# Patient Record
Sex: Female | Born: 1935
Health system: Southern US, Community
[De-identification: ages and names within clinical notes are randomized; demographics above are authoritative.]

## PROBLEM LIST (undated history)

## (undated) DIAGNOSIS — M25569 Pain in unspecified knee: Secondary | ICD-10-CM

## (undated) DIAGNOSIS — M810 Age-related osteoporosis without current pathological fracture: Secondary | ICD-10-CM

## (undated) DIAGNOSIS — M1990 Unspecified osteoarthritis, unspecified site: Secondary | ICD-10-CM

## (undated) DIAGNOSIS — R413 Other amnesia: Secondary | ICD-10-CM

## (undated) DIAGNOSIS — D510 Vitamin B12 deficiency anemia due to intrinsic factor deficiency: Secondary | ICD-10-CM

## (undated) DIAGNOSIS — Z5320 Procedure and treatment not carried out because of patient's decision for unspecified reasons: Secondary | ICD-10-CM

## (undated) DIAGNOSIS — G3184 Mild cognitive impairment, so stated: Secondary | ICD-10-CM

## (undated) DIAGNOSIS — R011 Cardiac murmur, unspecified: Secondary | ICD-10-CM

## (undated) DIAGNOSIS — Z87440 Personal history of urinary (tract) infections: Secondary | ICD-10-CM

## (undated) DIAGNOSIS — I10 Essential (primary) hypertension: Secondary | ICD-10-CM

## (undated) HISTORY — DX: Essential (primary) hypertension: I10

## (undated) HISTORY — DX: Vitamin B12 deficiency anemia due to intrinsic factor deficiency: D51.0

## (undated) HISTORY — DX: Other amnesia: R41.3

## (undated) HISTORY — PX: ABDOMINAL HYSTERECTOMY: SHX81

## (undated) HISTORY — DX: Procedure and treatment not carried out because of patient's decision for unspecified reasons: Z53.20

## (undated) HISTORY — DX: Personal history of urinary (tract) infections: Z87.440

## (undated) HISTORY — DX: Age-related osteoporosis without current pathological fracture: M81.0

## (undated) HISTORY — PX: EYE SURGERY: SHX253

## (undated) HISTORY — DX: Mild cognitive impairment of uncertain or unknown etiology: G31.84

## (undated) HISTORY — DX: Unspecified osteoarthritis, unspecified site: M19.90

## (undated) HISTORY — PX: ROOT CANAL: SHX2363

## (undated) HISTORY — DX: Pain in unspecified knee: M25.569

---

## 2003-08-02 ENCOUNTER — Other Ambulatory Visit: Admission: RE | Admit: 2003-08-02 | Discharge: 2003-08-02 | Payer: Self-pay | Admitting: Family Medicine

## 2010-05-05 ENCOUNTER — Encounter: Payer: Self-pay | Admitting: Cardiovascular Disease

## 2010-05-05 ENCOUNTER — Ambulatory Visit: Payer: Self-pay

## 2010-10-27 NOTE — Procedures (Signed)
Summary: Summary Report  Summary Report   Imported By: Erle Crocker 05/08/2010 08:32:36  _____________________________________________________________________  External Attachment:    Type:   Image     Comment:   External Document

## 2011-06-01 ENCOUNTER — Encounter: Payer: Self-pay | Admitting: Cardiovascular Disease

## 2011-06-02 ENCOUNTER — Ambulatory Visit (INDEPENDENT_AMBULATORY_CARE_PROVIDER_SITE_OTHER): Payer: Medicare HMO | Admitting: Cardiovascular Disease

## 2011-06-02 ENCOUNTER — Encounter: Payer: Self-pay | Admitting: Cardiovascular Disease

## 2011-06-02 DIAGNOSIS — I498 Other specified cardiac arrhythmias: Secondary | ICD-10-CM

## 2011-06-02 DIAGNOSIS — Z01810 Encounter for preprocedural cardiovascular examination: Secondary | ICD-10-CM | POA: Insufficient documentation

## 2011-06-02 DIAGNOSIS — R001 Bradycardia, unspecified: Secondary | ICD-10-CM

## 2011-06-02 NOTE — Assessment & Plan Note (Signed)
Healthy active 75 yo with no symptoms undergoing low risk arthroscopic surgery.  Clear with no further w/u

## 2011-06-02 NOTE — Progress Notes (Signed)
75 yo referred for preop clearence.  Needs arthroscopic knee surgery with Dr Valentina Gu.  Has a history of relatively low HR.  A few months ago in [place] colonoscopy postponed due to low HR but done 48 hours latter with no w/u or med change.  She has no cardiac history.  She is verry active and dances.  She has no presyncope, palpitatons, SSCP or dsypnea.  Reviewed monitor done 8/11 and NSR only one episode of SA exit block pause 2.6 seconds.  On no AV nodal blocking drugs.  Baseline ECG reviewed and no BBB and normal PR.  Patient is asymptomatic, active and undergoing low risk surgery.  Needs no further w/u.  Avoid anesthesia with negative chronotropic effect or AV nodal blocking effect.  Avoid excessive pain that may trigger vagal outflow.  ROS: Denies fever, malais, weight loss, blurry vision, decreased visual acuity, cough, sputum, SOB, hemoptysis, pleuritic pain, palpitaitons, heartburn, abdominal pain, melena, lower extremity edema, claudication, or rash.  All other systems reviewed and negative   General: Affect appropriate Healthy:  appears stated age HEENT: normal Neck supple with no adenopathy JVP normal no bruits no thyromegaly Lungs clear with no wheezing and good diaphragmatic motion Heart:  S1/S2 no murmur,rub, gallop or click PMI normal Abdomen: benighn, BS positve, no tenderness, no AAA no bruit.  No HSM or HJR Distal pulses intact with no bruits No edema Neuro non-focal Skin warm and dry No muscular weakness  Medications Current Outpatient Prescriptions  Medication Sig Dispense Refill  . Calcium Carbonate-Vit D-Min (CALCIUM 1200 PO) Take 1 capsule by mouth daily.        . Calcium-Vitamin D-Vitamin K (VIACTIV) 500-100-40 MG-UNT-MCG CHEW Chew 2 each by mouth daily.        Marland Kitchen glucosamine-chondroitin 500-400 MG tablet Take 1 tablet by mouth 2 (two) times daily.        . Omega-3 Fatty Acids (FISH OIL) 1000 MG CAPS Take 1 capsule by mouth 2 (two) times daily.           Allergies Bactrim  Family History: No family history on file.  Social History: History   Social History  . Marital Status: Single    Spouse Name: N/A    Number of Children: N/A  . Years of Education: N/A   Occupational History  . Not on file.   Social History Main Topics  . Smoking status: Never Smoker   . Smokeless tobacco: Never Used  . Alcohol Use: No  . Drug Use: No  . Sexually Active: Not on file   Other Topics Concern  . Not on file   Social History Narrative  . No narrative on file    Electrocardiogram:  NSR 59 low voltage due to breast tissue.  Otherwise normal PR 178 QRS 62  Assessment and Plan

## 2011-06-02 NOTE — Patient Instructions (Signed)
Your physician recommends that you schedule a follow-up appointment in: AS NEEDED  Your physician recommends that you continue on your current medications as directed. Please refer to the Current Medication list given to you today.  

## 2011-06-02 NOTE — Assessment & Plan Note (Signed)
Normal ECG.  Monitor last year benign.  May be prone to vagal reaction.  Avoid anesthesia with negative chronotropic properties and Rx post procedure pain agressively to avoid vagal reacton.  No indication for any other cardiac w/u

## 2011-12-28 ENCOUNTER — Telehealth: Payer: Self-pay | Admitting: Cardiovascular Disease

## 2011-12-28 NOTE — Telephone Encounter (Signed)
LOV,12,Monitor faxed to Dr.Patseavouras @ [phone] 12/28/11/KM

## 2012-05-10 ENCOUNTER — Other Ambulatory Visit (HOSPITAL_COMMUNITY): Payer: Self-pay | Admitting: Urology

## 2012-05-10 DIAGNOSIS — R52 Pain, unspecified: Secondary | ICD-10-CM

## 2012-05-24 ENCOUNTER — Ambulatory Visit (HOSPITAL_COMMUNITY)
Admission: RE | Admit: 2012-05-24 | Discharge: 2012-05-24 | Disposition: A | Discharge status: Home / Self Care | Payer: Medicare HMO | Source: Ambulatory Visit | Attending: Urology | Admitting: Urology

## 2012-05-24 DIAGNOSIS — R52 Pain, unspecified: Secondary | ICD-10-CM

## 2012-05-24 DIAGNOSIS — N750 Cyst of Bartholin's gland: Secondary | ICD-10-CM | POA: Insufficient documentation

## 2012-05-24 DIAGNOSIS — K5730 Diverticulosis of large intestine without perforation or abscess without bleeding: Secondary | ICD-10-CM | POA: Insufficient documentation

## 2012-05-24 DIAGNOSIS — R109 Unspecified abdominal pain: Secondary | ICD-10-CM | POA: Insufficient documentation

## 2012-05-24 DIAGNOSIS — G589 Mononeuropathy, unspecified: Secondary | ICD-10-CM | POA: Insufficient documentation

## 2012-05-24 MED ORDER — GADOBENATE DIMEGLUMINE 529 MG/ML IV SOLN
15.0000 mL | Freq: Once | INTRAVENOUS | Status: AC | PRN
  Administered 2012-05-24: 15 mL via INTRAVENOUS

## 2014-10-10 DIAGNOSIS — M818 Other osteoporosis without current pathological fracture: Secondary | ICD-10-CM | POA: Diagnosis not present

## 2014-10-10 DIAGNOSIS — R030 Elevated blood-pressure reading, without diagnosis of hypertension: Secondary | ICD-10-CM | POA: Diagnosis not present

## 2014-10-10 DIAGNOSIS — Z131 Encounter for screening for diabetes mellitus: Secondary | ICD-10-CM | POA: Diagnosis not present

## 2014-10-10 DIAGNOSIS — R001 Bradycardia, unspecified: Secondary | ICD-10-CM | POA: Diagnosis not present

## 2014-10-10 DIAGNOSIS — Z1239 Encounter for other screening for malignant neoplasm of breast: Secondary | ICD-10-CM | POA: Diagnosis not present

## 2014-10-10 DIAGNOSIS — Z23 Encounter for immunization: Secondary | ICD-10-CM | POA: Diagnosis not present

## 2014-10-10 DIAGNOSIS — Z136 Encounter for screening for cardiovascular disorders: Secondary | ICD-10-CM | POA: Diagnosis not present

## 2014-10-10 DIAGNOSIS — Z0000 Encounter for general adult medical examination without abnormal findings: Secondary | ICD-10-CM | POA: Diagnosis not present

## 2014-10-24 DIAGNOSIS — Z1231 Encounter for screening mammogram for malignant neoplasm of breast: Secondary | ICD-10-CM | POA: Diagnosis not present

## 2014-11-15 DIAGNOSIS — Z131 Encounter for screening for diabetes mellitus: Secondary | ICD-10-CM | POA: Diagnosis not present

## 2014-11-15 DIAGNOSIS — Z136 Encounter for screening for cardiovascular disorders: Secondary | ICD-10-CM | POA: Diagnosis not present

## 2014-12-04 DIAGNOSIS — B009 Herpesviral infection, unspecified: Secondary | ICD-10-CM | POA: Diagnosis not present

## 2015-03-12 DIAGNOSIS — J069 Acute upper respiratory infection, unspecified: Secondary | ICD-10-CM | POA: Diagnosis not present

## 2015-04-28 DIAGNOSIS — R419 Unspecified symptoms and signs involving cognitive functions and awareness: Secondary | ICD-10-CM | POA: Diagnosis not present

## 2015-11-21 DIAGNOSIS — Z1231 Encounter for screening mammogram for malignant neoplasm of breast: Secondary | ICD-10-CM | POA: Diagnosis not present

## 2015-12-02 DIAGNOSIS — R05 Cough: Secondary | ICD-10-CM | POA: Diagnosis not present

## 2015-12-18 DIAGNOSIS — N63 Unspecified lump in breast: Secondary | ICD-10-CM | POA: Diagnosis not present

## 2015-12-18 DIAGNOSIS — N6002 Solitary cyst of left breast: Secondary | ICD-10-CM | POA: Diagnosis not present

## 2016-01-05 DIAGNOSIS — H524 Presbyopia: Secondary | ICD-10-CM | POA: Diagnosis not present

## 2016-01-05 DIAGNOSIS — H5210 Myopia, unspecified eye: Secondary | ICD-10-CM | POA: Diagnosis not present

## 2016-01-27 DIAGNOSIS — M25562 Pain in left knee: Secondary | ICD-10-CM | POA: Diagnosis not present

## 2016-02-05 DIAGNOSIS — M25562 Pain in left knee: Secondary | ICD-10-CM | POA: Diagnosis not present

## 2016-02-05 DIAGNOSIS — M1712 Unilateral primary osteoarthritis, left knee: Secondary | ICD-10-CM | POA: Diagnosis not present

## 2016-02-11 DIAGNOSIS — Z0000 Encounter for general adult medical examination without abnormal findings: Secondary | ICD-10-CM | POA: Diagnosis not present

## 2016-02-11 DIAGNOSIS — R001 Bradycardia, unspecified: Secondary | ICD-10-CM | POA: Diagnosis not present

## 2016-02-11 DIAGNOSIS — M810 Age-related osteoporosis without current pathological fracture: Secondary | ICD-10-CM | POA: Diagnosis not present

## 2016-02-11 DIAGNOSIS — Z01810 Encounter for preprocedural cardiovascular examination: Secondary | ICD-10-CM | POA: Diagnosis not present

## 2016-02-11 DIAGNOSIS — Z86010 Personal history of colonic polyps: Secondary | ICD-10-CM | POA: Diagnosis not present

## 2016-02-11 DIAGNOSIS — M25562 Pain in left knee: Secondary | ICD-10-CM | POA: Diagnosis not present

## 2016-02-17 ENCOUNTER — Other Ambulatory Visit: Payer: Self-pay | Admitting: Orthopedic Surgery

## 2016-02-19 DIAGNOSIS — H25811 Combined forms of age-related cataract, right eye: Secondary | ICD-10-CM | POA: Diagnosis not present

## 2016-02-19 DIAGNOSIS — H2511 Age-related nuclear cataract, right eye: Secondary | ICD-10-CM | POA: Diagnosis not present

## 2016-02-26 DIAGNOSIS — H25812 Combined forms of age-related cataract, left eye: Secondary | ICD-10-CM | POA: Diagnosis not present

## 2016-02-26 DIAGNOSIS — H2512 Age-related nuclear cataract, left eye: Secondary | ICD-10-CM | POA: Diagnosis not present

## 2016-03-10 DIAGNOSIS — G8929 Other chronic pain: Secondary | ICD-10-CM | POA: Insufficient documentation

## 2016-03-11 ENCOUNTER — Encounter (HOSPITAL_COMMUNITY)
Admission: RE | Admit: 2016-03-11 | Discharge: 2016-03-11 | Disposition: A | Discharge status: Home / Self Care | Payer: Commercial Managed Care - HMO | Source: Ambulatory Visit | Attending: Orthopedic Surgery | Admitting: Orthopedic Surgery

## 2016-03-11 ENCOUNTER — Ambulatory Visit (HOSPITAL_COMMUNITY)
Admission: RE | Admit: 2016-03-11 | Discharge: 2016-03-11 | Disposition: A | Discharge status: Home / Self Care | Payer: Commercial Managed Care - HMO | Source: Ambulatory Visit | Attending: Orthopedic Surgery | Admitting: Orthopedic Surgery

## 2016-03-11 ENCOUNTER — Encounter (HOSPITAL_COMMUNITY): Payer: Self-pay

## 2016-03-11 DIAGNOSIS — Z01812 Encounter for preprocedural laboratory examination: Secondary | ICD-10-CM | POA: Insufficient documentation

## 2016-03-11 DIAGNOSIS — Z01818 Encounter for other preprocedural examination: Secondary | ICD-10-CM | POA: Diagnosis not present

## 2016-03-11 HISTORY — DX: Unspecified osteoarthritis, unspecified site: M19.90

## 2016-03-11 HISTORY — DX: Cardiac murmur, unspecified: R01.1

## 2016-03-11 LAB — URINALYSIS, ROUTINE W REFLEX MICROSCOPIC
Bilirubin Urine: NEGATIVE
GLUCOSE, UA: NEGATIVE mg/dL
KETONES UR: NEGATIVE mg/dL
NITRITE: POSITIVE — AB
PH: 7 (ref 5.0–8.0)
PROTEIN: NEGATIVE mg/dL
Specific Gravity, Urine: 1.005 (ref 1.005–1.030)

## 2016-03-11 LAB — CBC WITH DIFFERENTIAL/PLATELET
BASOS PCT: 1 %
Basophils Absolute: 0.1 10*3/uL (ref 0.0–0.1)
EOS ABS: 0.3 10*3/uL (ref 0.0–0.7)
EOS PCT: 4 %
HCT: 43.9 % (ref 36.0–46.0)
Hemoglobin: 14.8 g/dL (ref 12.0–15.0)
LYMPHS ABS: 2.9 10*3/uL (ref 0.7–4.0)
Lymphocytes Relative: 33 %
MCH: 29.8 pg (ref 26.0–34.0)
MCHC: 33.7 g/dL (ref 30.0–36.0)
MCV: 88.3 fL (ref 78.0–100.0)
MONOS PCT: 7 %
Monocytes Absolute: 0.6 10*3/uL (ref 0.1–1.0)
Neutro Abs: 4.8 10*3/uL (ref 1.7–7.7)
Neutrophils Relative %: 55 %
PLATELETS: 353 10*3/uL (ref 150–400)
RBC: 4.97 MIL/uL (ref 3.87–5.11)
RDW: 13.6 % (ref 11.5–15.5)
WBC: 8.7 10*3/uL (ref 4.0–10.5)

## 2016-03-11 LAB — COMPREHENSIVE METABOLIC PANEL
ALT: 14 U/L (ref 14–54)
ANION GAP: 6 (ref 5–15)
AST: 21 U/L (ref 15–41)
Albumin: 3.9 g/dL (ref 3.5–5.0)
Alkaline Phosphatase: 63 U/L (ref 38–126)
BUN: 6 mg/dL (ref 6–20)
CHLORIDE: 103 mmol/L (ref 101–111)
CO2: 28 mmol/L (ref 22–32)
Calcium: 9.4 mg/dL (ref 8.9–10.3)
Creatinine, Ser: 0.93 mg/dL (ref 0.44–1.00)
GFR calc non Af Amer: 58 mL/min — ABNORMAL LOW (ref >60)
Glucose, Bld: 99 mg/dL (ref 65–99)
POTASSIUM: 4 mmol/L (ref 3.5–5.1)
SODIUM: 137 mmol/L (ref 135–145)
Total Bilirubin: 1.3 mg/dL — ABNORMAL HIGH (ref 0.3–1.2)
Total Protein: 6.5 g/dL (ref 6.5–8.1)

## 2016-03-11 LAB — SURGICAL PCR SCREEN
MRSA, PCR: NEGATIVE
Staphylococcus aureus: POSITIVE — AB

## 2016-03-11 LAB — PROTIME-INR
INR: 1.09 (ref 0.00–1.49)
Prothrombin Time: 14.3 seconds (ref 11.6–15.2)

## 2016-03-11 LAB — URINE MICROSCOPIC-ADD ON

## 2016-03-11 LAB — APTT: aPTT: 31 seconds (ref 24–37)

## 2016-03-12 LAB — URINE CULTURE

## 2016-03-12 NOTE — Progress Notes (Signed)
Anesthesia Chart Review:  Pt is a 80 year old female scheduled for L total knee arthroplasty on 03/22/2016 with Dr. Ronnie Derby.   PCP is Dr. Greig Right in [place] who has cleared pt for surgery.   PMH includes:  Heart murmur (systolic 2/6 by PCP exam [id number]). Never smoker. BMI 22  Medications include: potassium  Preoperative labs reviewed.    Chest x-ray 03/11/16 reviewed. No active cardiopulmonary disease  EKG 02/11/16: sinus bradycardia (48 bpm).   Holter monitor 05/07/10: NSR with episodes of SA exit block. Longest pause 2.6 seconds. Rare PVCs.   Pt has had work up previously (2011, 2012) for low HR and with no concerning findings. When pt saw Dr. Johnsie Cancel with cardiology for pre-op clearance in 2012, he suggested avoiding "anesthesia with negative chronotropic properties and Rx post procedure pain agressively to avoid vagal reaction".   If no changes, I anticipate pt can proceed with surgery as scheduled.   Willeen Cass, FNP-BC [facility]/Anesthesiology Phone: [phone] 03/12/2016 2:04 PM

## 2016-03-19 MED ORDER — SODIUM CHLORIDE 0.9 % IV SOLN: INTRAVENOUS | Status: DC

## 2016-03-19 MED ORDER — CEFAZOLIN SODIUM-DEXTROSE 2-4 GM/100ML-% IV SOLN
2.0000 g | INTRAVENOUS | Status: AC
  Administered 2016-03-22: 2 g via INTRAVENOUS
  Filled 2016-03-19: qty 100

## 2016-03-19 MED ORDER — ACETAMINOPHEN 500 MG PO TABS
1000.0000 mg | ORAL_TABLET | Freq: Once | ORAL | Status: AC
  Administered 2016-03-22: 1000 mg via ORAL
  Filled 2016-03-19: qty 2

## 2016-03-19 MED ORDER — BUPIVACAINE LIPOSOME 1.3 % IJ SUSP
20.0000 mL | INTRAMUSCULAR | Status: AC
  Administered 2016-03-22: 20 mL
  Filled 2016-03-19: qty 20

## 2016-03-19 MED ORDER — TRANEXAMIC ACID 1000 MG/10ML IV SOLN
1000.0000 mg | INTRAVENOUS | Status: AC
  Administered 2016-03-22: 1000 mg via INTRAVENOUS
  Filled 2016-03-19: qty 10

## 2016-03-22 ENCOUNTER — Encounter (HOSPITAL_COMMUNITY)
Admission: RE | Disposition: A | Discharge status: Home Health | Payer: Self-pay | Source: Ambulatory Visit | Attending: Orthopedic Surgery

## 2016-03-22 ENCOUNTER — Inpatient Hospital Stay (HOSPITAL_COMMUNITY): Payer: Commercial Managed Care - HMO | Admitting: Emergency Medicine

## 2016-03-22 ENCOUNTER — Encounter (HOSPITAL_COMMUNITY): Payer: Self-pay | Admitting: Certified Registered"

## 2016-03-22 ENCOUNTER — Inpatient Hospital Stay (HOSPITAL_COMMUNITY)
Admission: RE | Admit: 2016-03-22 | Discharge: 2016-03-23 | DRG: 470 | Disposition: A | Discharge status: Home Health | Payer: Commercial Managed Care - HMO | Source: Ambulatory Visit | Attending: Orthopedic Surgery | Admitting: Orthopedic Surgery

## 2016-03-22 ENCOUNTER — Inpatient Hospital Stay (HOSPITAL_COMMUNITY): Payer: Commercial Managed Care - HMO | Admitting: Anesthesiology

## 2016-03-22 DIAGNOSIS — D62 Acute posthemorrhagic anemia: Secondary | ICD-10-CM | POA: Diagnosis not present

## 2016-03-22 DIAGNOSIS — M25562 Pain in left knee: Secondary | ICD-10-CM | POA: Diagnosis present

## 2016-03-22 DIAGNOSIS — M179 Osteoarthritis of knee, unspecified: Secondary | ICD-10-CM | POA: Diagnosis not present

## 2016-03-22 DIAGNOSIS — M1712 Unilateral primary osteoarthritis, left knee: Secondary | ICD-10-CM | POA: Diagnosis not present

## 2016-03-22 DIAGNOSIS — Z96659 Presence of unspecified artificial knee joint: Secondary | ICD-10-CM

## 2016-03-22 HISTORY — PX: TOTAL KNEE ARTHROPLASTY: SHX125

## 2016-03-22 SURGERY — ARTHROPLASTY, KNEE, TOTAL
Anesthesia: Spinal | Laterality: Left

## 2016-03-22 MED ORDER — 0.9 % SODIUM CHLORIDE (POUR BTL) OPTIME
TOPICAL | Status: DC | PRN
  Administered 2016-03-22: 1000 mL

## 2016-03-22 MED ORDER — HYDROMORPHONE HCL 1 MG/ML IJ SOLN: 1.0000 mg | INTRAMUSCULAR | Status: DC | PRN

## 2016-03-22 MED ORDER — SENNOSIDES-DOCUSATE SODIUM 8.6-50 MG PO TABS: 1.0000 | ORAL_TABLET | Freq: Every evening | ORAL | Status: DC | PRN

## 2016-03-22 MED ORDER — METOCLOPRAMIDE HCL 5 MG/ML IJ SOLN
5.0000 mg | Freq: Three times a day (TID) | INTRAMUSCULAR | Status: DC | PRN
  Administered 2016-03-23: 10 mg via INTRAVENOUS
  Filled 2016-03-22 (×2): qty 2

## 2016-03-22 MED ORDER — ONDANSETRON HCL 4 MG PO TABS
4.0000 mg | ORAL_TABLET | Freq: Four times a day (QID) | ORAL | Status: DC | PRN
  Administered 2016-03-23: 4 mg via ORAL
  Filled 2016-03-22 (×2): qty 1

## 2016-03-22 MED ORDER — ROCURONIUM BROMIDE 50 MG/5ML IV SOLN
INTRAVENOUS | Status: AC
  Filled 2016-03-22: qty 1

## 2016-03-22 MED ORDER — ACETAMINOPHEN 325 MG PO TABS: 650.0000 mg | ORAL_TABLET | Freq: Four times a day (QID) | ORAL | Status: DC | PRN

## 2016-03-22 MED ORDER — PROPOFOL 10 MG/ML IV BOLUS
INTRAVENOUS | Status: AC
  Filled 2016-03-22: qty 20

## 2016-03-22 MED ORDER — BUPIVACAINE-EPINEPHRINE (PF) 0.5% -1:200000 IJ SOLN
INTRAMUSCULAR | Status: AC
  Filled 2016-03-22: qty 30

## 2016-03-22 MED ORDER — DEXTROSE 5 % IV SOLN
500.0000 mg | Freq: Four times a day (QID) | INTRAVENOUS | Status: DC | PRN
  Filled 2016-03-22: qty 5

## 2016-03-22 MED ORDER — HYDROMORPHONE HCL 1 MG/ML IJ SOLN
0.2500 mg | INTRAMUSCULAR | Status: DC | PRN
  Administered 2016-03-22: 0.25 mg via INTRAVENOUS

## 2016-03-22 MED ORDER — MIDAZOLAM HCL 2 MG/2ML IJ SOLN
INTRAMUSCULAR | Status: AC
  Filled 2016-03-22: qty 2

## 2016-03-22 MED ORDER — FENTANYL CITRATE (PF) 250 MCG/5ML IJ SOLN
INTRAMUSCULAR | Status: AC
  Filled 2016-03-22: qty 5

## 2016-03-22 MED ORDER — SODIUM CHLORIDE 0.9 % IJ SOLN
INTRAMUSCULAR | Status: DC | PRN
  Administered 2016-03-22 (×2): 10 mL

## 2016-03-22 MED ORDER — METHOCARBAMOL 500 MG PO TABS
500.0000 mg | ORAL_TABLET | Freq: Four times a day (QID) | ORAL | Status: DC | PRN
  Filled 2016-03-22: qty 1

## 2016-03-22 MED ORDER — FLEET ENEMA 7-19 GM/118ML RE ENEM: 1.0000 | ENEMA | Freq: Once | RECTAL | Status: DC | PRN

## 2016-03-22 MED ORDER — DOCUSATE SODIUM 100 MG PO CAPS
100.0000 mg | ORAL_CAPSULE | Freq: Two times a day (BID) | ORAL | Status: DC
  Administered 2016-03-22 – 2016-03-23 (×2): 100 mg via ORAL
  Filled 2016-03-22 (×2): qty 1

## 2016-03-22 MED ORDER — LACTATED RINGERS IV SOLN
INTRAVENOUS | Status: DC
  Administered 2016-03-22 (×2): via INTRAVENOUS

## 2016-03-22 MED ORDER — BUPIVACAINE-EPINEPHRINE 0.5% -1:200000 IJ SOLN
INTRAMUSCULAR | Status: DC | PRN
  Administered 2016-03-22: 20 mL

## 2016-03-22 MED ORDER — ACETAMINOPHEN 650 MG RE SUPP
650.0000 mg | Freq: Four times a day (QID) | RECTAL | Status: DC | PRN
Start: 2016-03-22 — End: 2016-03-23

## 2016-03-22 MED ORDER — CEFAZOLIN SODIUM-DEXTROSE 2-4 GM/100ML-% IV SOLN
2.0000 g | Freq: Three times a day (TID) | INTRAVENOUS | Status: AC
  Administered 2016-03-22 – 2016-03-23 (×2): 2 g via INTRAVENOUS
  Filled 2016-03-22 (×2): qty 100

## 2016-03-22 MED ORDER — MEPERIDINE HCL 25 MG/ML IJ SOLN: 6.2500 mg | INTRAMUSCULAR | Status: DC | PRN

## 2016-03-22 MED ORDER — ONDANSETRON HCL 4 MG/2ML IJ SOLN
4.0000 mg | Freq: Four times a day (QID) | INTRAMUSCULAR | Status: DC | PRN
  Administered 2016-03-23: 4 mg via INTRAVENOUS
  Filled 2016-03-22 (×3): qty 2

## 2016-03-22 MED ORDER — MENTHOL 3 MG MT LOZG
1.0000 | LOZENGE | OROMUCOSAL | Status: DC | PRN
Start: 2016-03-22 — End: 2016-03-23

## 2016-03-22 MED ORDER — LIDOCAINE HCL (CARDIAC) 20 MG/ML IV SOLN
INTRAVENOUS | Status: DC | PRN
  Administered 2016-03-22: 40 mg via INTRAVENOUS

## 2016-03-22 MED ORDER — DEXAMETHASONE SODIUM PHOSPHATE 10 MG/ML IJ SOLN
10.0000 mg | Freq: Once | INTRAMUSCULAR | Status: AC
  Administered 2016-03-23: 10 mg via INTRAVENOUS
  Filled 2016-03-22: qty 1

## 2016-03-22 MED ORDER — BUPIVACAINE IN DEXTROSE 0.75-8.25 % IT SOLN
INTRATHECAL | Status: DC | PRN
  Administered 2016-03-22: 2 mL via INTRATHECAL

## 2016-03-22 MED ORDER — PHENOL 1.4 % MT LIQD: 1.0000 | OROMUCOSAL | Status: DC | PRN

## 2016-03-22 MED ORDER — DIPHENHYDRAMINE HCL 12.5 MG/5ML PO ELIX: 12.5000 mg | ORAL_SOLUTION | ORAL | Status: DC | PRN

## 2016-03-22 MED ORDER — GLYCOPYRROLATE 0.2 MG/ML IJ SOLN
INTRAMUSCULAR | Status: DC | PRN
  Administered 2016-03-22: .2 mg via INTRAVENOUS

## 2016-03-22 MED ORDER — PHENYLEPHRINE HCL 10 MG/ML IJ SOLN
10.0000 mg | INTRAVENOUS | Status: DC | PRN
  Administered 2016-03-22: 25 ug/min via INTRAVENOUS

## 2016-03-22 MED ORDER — ZOLPIDEM TARTRATE 5 MG PO TABS: 5.0000 mg | ORAL_TABLET | Freq: Every evening | ORAL | Status: DC | PRN

## 2016-03-22 MED ORDER — OXYCODONE HCL ER 10 MG PO T12A
10.0000 mg | EXTENDED_RELEASE_TABLET | Freq: Two times a day (BID) | ORAL | Status: DC
  Administered 2016-03-22 – 2016-03-23 (×2): 10 mg via ORAL
  Filled 2016-03-22 (×2): qty 1

## 2016-03-22 MED ORDER — ALUM & MAG HYDROXIDE-SIMETH 200-200-20 MG/5ML PO SUSP: 30.0000 mL | ORAL | Status: DC | PRN

## 2016-03-22 MED ORDER — ONDANSETRON HCL 4 MG/2ML IJ SOLN: 4.0000 mg | Freq: Once | INTRAMUSCULAR | Status: DC | PRN

## 2016-03-22 MED ORDER — DIPHENHYDRAMINE HCL 50 MG/ML IJ SOLN
INTRAMUSCULAR | Status: AC
  Filled 2016-03-22: qty 3

## 2016-03-22 MED ORDER — METOCLOPRAMIDE HCL 5 MG PO TABS
5.0000 mg | ORAL_TABLET | Freq: Three times a day (TID) | ORAL | Status: DC | PRN
  Filled 2016-03-22: qty 2

## 2016-03-22 MED ORDER — EPHEDRINE SULFATE 50 MG/ML IJ SOLN
INTRAMUSCULAR | Status: DC | PRN
  Administered 2016-03-22 (×2): 5 mg via INTRAVENOUS

## 2016-03-22 MED ORDER — PROPOFOL 10 MG/ML IV BOLUS
INTRAVENOUS | Status: DC | PRN
  Administered 2016-03-22: 20 mg via INTRAVENOUS

## 2016-03-22 MED ORDER — SODIUM CHLORIDE 0.9 % IV SOLN
INTRAVENOUS | Status: DC
  Administered 2016-03-22: 21:00:00 via INTRAVENOUS

## 2016-03-22 MED ORDER — MIDAZOLAM HCL 5 MG/5ML IJ SOLN
INTRAMUSCULAR | Status: DC | PRN
  Administered 2016-03-22: 1 mg via INTRAVENOUS

## 2016-03-22 MED ORDER — TRANEXAMIC ACID 1000 MG/10ML IV SOLN
1000.0000 mg | Freq: Once | INTRAVENOUS | Status: AC
  Administered 2016-03-22: 1000 mg via INTRAVENOUS
  Filled 2016-03-22: qty 10

## 2016-03-22 MED ORDER — CIPROFLOXACIN IN D5W 400 MG/200ML IV SOLN
INTRAVENOUS | Status: AC
  Administered 2016-03-22: 400 mg via INTRAVENOUS
  Filled 2016-03-22: qty 200

## 2016-03-22 MED ORDER — BISACODYL 5 MG PO TBEC: 5.0000 mg | DELAYED_RELEASE_TABLET | Freq: Every day | ORAL | Status: DC | PRN

## 2016-03-22 MED ORDER — FENTANYL CITRATE (PF) 250 MCG/5ML IJ SOLN
INTRAMUSCULAR | Status: DC | PRN
  Administered 2016-03-22 (×2): 25 ug via INTRAVENOUS
  Administered 2016-03-22: 50 ug via INTRAVENOUS

## 2016-03-22 MED ORDER — HYDROMORPHONE HCL 1 MG/ML IJ SOLN
INTRAMUSCULAR | Status: AC
  Filled 2016-03-22: qty 1

## 2016-03-22 MED ORDER — SODIUM CHLORIDE 0.9 % IR SOLN
Status: DC | PRN
  Administered 2016-03-22 (×2): 1000 mL

## 2016-03-22 MED ORDER — ACETAMINOPHEN 500 MG PO TABS
1000.0000 mg | ORAL_TABLET | Freq: Four times a day (QID) | ORAL | Status: AC
  Administered 2016-03-22 – 2016-03-23 (×4): 1000 mg via ORAL
  Filled 2016-03-22 (×4): qty 2

## 2016-03-22 MED ORDER — CIPROFLOXACIN IN D5W 400 MG/200ML IV SOLN
400.0000 mg | Freq: Once | INTRAVENOUS | Status: AC
  Administered 2016-03-22: 400 mg via INTRAVENOUS

## 2016-03-22 MED ORDER — ASPIRIN EC 325 MG PO TBEC
325.0000 mg | DELAYED_RELEASE_TABLET | Freq: Two times a day (BID) | ORAL | Status: DC
  Administered 2016-03-22 – 2016-03-23 (×2): 325 mg via ORAL
  Filled 2016-03-22 (×2): qty 1

## 2016-03-22 MED ORDER — PROPOFOL 500 MG/50ML IV EMUL
INTRAVENOUS | Status: DC | PRN
  Administered 2016-03-22: 40 ug/kg/min via INTRAVENOUS

## 2016-03-22 MED ORDER — OXYCODONE HCL 5 MG PO TABS
5.0000 mg | ORAL_TABLET | ORAL | Status: DC | PRN
  Administered 2016-03-22 – 2016-03-23 (×3): 10 mg via ORAL
  Administered 2016-03-23: 5 mg via ORAL
  Filled 2016-03-22: qty 1
  Filled 2016-03-22 (×4): qty 2

## 2016-03-22 MED ORDER — CHLORHEXIDINE GLUCONATE 4 % EX LIQD: 60.0000 mL | Freq: Once | CUTANEOUS | Status: DC

## 2016-03-22 SURGICAL SUPPLY — 66 items
BANDAGE ACE 6X5 VEL STRL LF (GAUZE/BANDAGES/DRESSINGS) ×2 IMPLANT
BANDAGE ESMARK 6X9 LF (GAUZE/BANDAGES/DRESSINGS) ×1 IMPLANT
BLADE SAGITTAL 13X1.27X60 (BLADE) ×2 IMPLANT
BLADE SAGITTAL 13X1.27X60MM (BLADE) ×1
BLADE SAW SGTL 83.5X18.5 (BLADE) ×3 IMPLANT
BLADE SURG 10 STRL SS (BLADE) ×3 IMPLANT
BNDG CMPR 9X6 STRL LF SNTH (GAUZE/BANDAGES/DRESSINGS) ×1
BNDG ESMARK 6X9 LF (GAUZE/BANDAGES/DRESSINGS) ×3
BOWL SMART MIX CTS (DISPOSABLE) ×3 IMPLANT
CAPT KNEE TOTAL 3 ×3 IMPLANT
CEMENT BONE SIMPLEX SPEEDSET (Cement) ×6 IMPLANT
CLOSURE STERI-STRIP 1/2X4 (GAUZE/BANDAGES/DRESSINGS) ×1
CLSR STERI-STRIP ANTIMIC 1/2X4 (GAUZE/BANDAGES/DRESSINGS) ×1 IMPLANT
COVER SURGICAL LIGHT HANDLE (MISCELLANEOUS) ×3 IMPLANT
CUFF TOURNIQUET SINGLE 34IN LL (TOURNIQUET CUFF) ×3 IMPLANT
DRAPE EXTREMITY T 121X128X90 (DRAPE) ×3 IMPLANT
DRAPE INCISE IOBAN 66X45 STRL (DRAPES) ×6 IMPLANT
DRAPE PROXIMA HALF (DRAPES) IMPLANT
DRAPE U-SHAPE 47X51 STRL (DRAPES) ×3 IMPLANT
DRESSING AQUACEL AG SP 3.5X10 (GAUZE/BANDAGES/DRESSINGS) IMPLANT
DRSG ADAPTIC 3X8 NADH LF (GAUZE/BANDAGES/DRESSINGS) ×3 IMPLANT
DRSG AQUACEL AG ADV 3.5X10 (GAUZE/BANDAGES/DRESSINGS) ×2 IMPLANT
DRSG AQUACEL AG SP 3.5X10 (GAUZE/BANDAGES/DRESSINGS) ×3
DRSG PAD ABDOMINAL 8X10 ST (GAUZE/BANDAGES/DRESSINGS) ×3 IMPLANT
DURAPREP 26ML APPLICATOR (WOUND CARE) ×6 IMPLANT
ELECT REM PT RETURN 9FT ADLT (ELECTROSURGICAL) ×3
ELECTRODE REM PT RTRN 9FT ADLT (ELECTROSURGICAL) ×1 IMPLANT
GAUZE SPONGE 4X4 12PLY STRL (GAUZE/BANDAGES/DRESSINGS) ×3 IMPLANT
GLOVE BIOGEL M 7.0 STRL (GLOVE) IMPLANT
GLOVE BIOGEL PI IND STRL 7.5 (GLOVE) IMPLANT
GLOVE BIOGEL PI IND STRL 8.5 (GLOVE) ×5 IMPLANT
GLOVE BIOGEL PI INDICATOR 7.5 (GLOVE)
GLOVE BIOGEL PI INDICATOR 8.5 (GLOVE) ×10
GLOVE SURG ORTHO 8.0 STRL STRW (GLOVE) ×18 IMPLANT
GOWN STRL REUS W/ TWL LRG LVL3 (GOWN DISPOSABLE) ×1 IMPLANT
GOWN STRL REUS W/ TWL XL LVL3 (GOWN DISPOSABLE) ×2 IMPLANT
GOWN STRL REUS W/TWL 2XL LVL3 (GOWN DISPOSABLE) ×3 IMPLANT
GOWN STRL REUS W/TWL LRG LVL3 (GOWN DISPOSABLE) ×3
GOWN STRL REUS W/TWL XL LVL3 (GOWN DISPOSABLE) ×6
HANDPIECE INTERPULSE COAX TIP (DISPOSABLE) ×3
HOOD PEEL AWAY FACE SHEILD DIS (HOOD) ×9 IMPLANT
KIT BASIN OR (CUSTOM PROCEDURE TRAY) ×3 IMPLANT
KIT ROOM TURNOVER OR (KITS) ×3 IMPLANT
KNEE CAPITATED TOTAL 3 IMPLANT
MANIFOLD NEPTUNE II (INSTRUMENTS) ×3 IMPLANT
NEEDLE 22X1 1/2 (OR ONLY) (NEEDLE) ×6 IMPLANT
NS IRRIG 1000ML POUR BTL (IV SOLUTION) ×3 IMPLANT
PACK TOTAL JOINT (CUSTOM PROCEDURE TRAY) ×3 IMPLANT
PACK UNIVERSAL I (CUSTOM PROCEDURE TRAY) ×3 IMPLANT
PAD ARMBOARD 7.5X6 YLW CONV (MISCELLANEOUS) ×6 IMPLANT
PADDING CAST COTTON 6X4 STRL (CAST SUPPLIES) ×3 IMPLANT
SET HNDPC FAN SPRY TIP SCT (DISPOSABLE) ×1 IMPLANT
STAPLER VISISTAT 35W (STAPLE) ×3 IMPLANT
SUCTION FRAZIER HANDLE 10FR (MISCELLANEOUS) ×2
SUCTION TUBE FRAZIER 10FR DISP (MISCELLANEOUS) ×1 IMPLANT
SUT BONE WAX W31G (SUTURE) ×3 IMPLANT
SUT MNCRL AB 3-0 PS2 18 (SUTURE) ×2 IMPLANT
SUT VIC AB 0 CTB1 27 (SUTURE) ×6 IMPLANT
SUT VIC AB 1 CT1 27 (SUTURE) ×9
SUT VIC AB 1 CT1 27XBRD ANBCTR (SUTURE) ×2 IMPLANT
SUT VIC AB 2-0 CT1 27 (SUTURE) ×6
SUT VIC AB 2-0 CT1 TAPERPNT 27 (SUTURE) ×2 IMPLANT
SYR 20CC LL (SYRINGE) ×6 IMPLANT
TOWEL OR 17X24 6PK STRL BLUE (TOWEL DISPOSABLE) ×3 IMPLANT
TOWEL OR 17X26 10 PK STRL BLUE (TOWEL DISPOSABLE) ×3 IMPLANT
WATER STERILE IRR 1000ML POUR (IV SOLUTION) ×6 IMPLANT

## 2016-03-22 NOTE — Progress Notes (Signed)
Orthopedic Tech Progress Note Patient Details:  Brenda Lang 1935-12-08 [id number] Applied CPM to LLE.  Applied OHF with trapeze to pt.'s bed.  Left Bone Foam with pt.'s nurse. CPM Left Knee CPM Left Knee: On Left Knee Flexion (Degrees): 90 Left Knee Extension (Degrees): 0   Darrol Poke 03/22/2016, 4:39 PM

## 2016-03-22 NOTE — Anesthesia Preprocedure Evaluation (Addendum)
Anesthesia Evaluation  Patient identified by MRN, date of birth, ID band Patient awake    Reviewed: Allergy & Precautions, NPO status , Patient's Chart, lab work & pertinent test results  Airway Mallampati: I  TM Distance: >3 FB Neck ROM: Full    Dental   Pulmonary    Pulmonary exam normal        Cardiovascular Normal cardiovascular exam+ Valvular Problems/Murmurs      Neuro/Psych    GI/Hepatic   Endo/Other    Renal/GU      Musculoskeletal   Abdominal   Peds  Hematology   Anesthesia Other Findings   Reproductive/Obstetrics                            Anesthesia Physical Anesthesia Plan  ASA: II  Anesthesia Plan: Spinal   Post-op Pain Management:    Induction: Intravenous  Airway Management Planned: Natural Airway  Additional Equipment:   Intra-op Plan:   Post-operative Plan:   Informed Consent: I have reviewed the patients History and Physical, chart, labs and discussed the procedure including the risks, benefits and alternatives for the proposed anesthesia with the patient or authorized representative who has indicated his/her understanding and acceptance.     Plan Discussed with: CRNA and Surgeon  Anesthesia Plan Comments: (Systolic murmur asymptomatic. OK for spinal.)       Anesthesia Quick Evaluation

## 2016-03-22 NOTE — Anesthesia Procedure Notes (Addendum)
Procedure Name: MAC Date/Time: 03/22/2016 1:22 PM Performed by: Mariea Clonts Pre-anesthesia Checklist: Patient identified, Emergency Drugs available, Suction available, Patient being monitored and Timeout performed Patient Re-evaluated:Patient Re-evaluated prior to inductionOxygen Delivery Method: Nasal cannula   Spinal  Start time: 03/22/2016 1:17 PM End time: 03/22/2016 1:20 PM Staffing Anesthesiologist: Lillia Abed Performed by: anesthesiologist  Preanesthetic Checklist Completed: patient identified, site marked, surgical consent, pre-op evaluation, timeout performed, IV checked, risks and benefits discussed and monitors and equipment checked Spinal Block Patient position: sitting Prep: Betadine Patient monitoring: heart rate, cardiac monitor, continuous pulse ox and blood pressure Approach: right paramedian Location: L3-4 Injection technique: single-shot Needle Needle type: Pencan  Needle gauge: 24 G Needle length: 9 cm Needle insertion depth: 6 cm

## 2016-03-22 NOTE — Progress Notes (Addendum)
Notified Jenny Reichmann, RN for Dr. Ronnie Derby about urine. She called back and stated that patient was treated with Cipro. I spoke to patient who stated that she had previously taken Cipro but had not taken it for about one year. Notified Jenny Reichmann, Therapist, sports.

## 2016-03-22 NOTE — H&P (Signed)
Brenda Lang MRN:  [id number] DOB/SEX:  [birth year]/female  CHIEF COMPLAINT:  Painful left Knee  HISTORY: Patient is a 80 y.o. female presented with a history of pain in the left knee. Onset of symptoms was gradual starting a few years ago with gradually worsening course since that time. Patient has been treated conservatively with over-the-counter NSAIDs and activity modification. Patient currently rates pain in the knee at 10 out of 10 with activity. There is pain at night.  PAST MEDICAL HISTORY: Patient Active Problem List   Diagnosis Date Noted  . Preoperative cardiovascular examination 06/02/2011  . Bradycardia 06/02/2011   Past Medical History  Diagnosis Date  . Knee pain   . Heart murmur   . Arthritis    Past Surgical History  Procedure Laterality Date  . Abdominal hysterectomy    . Eye surgery       MEDICATIONS:   No prescriptions prior to admission    ALLERGIES:   Allergies  Allergen Reactions  . Bactrim Rash    REVIEW OF SYSTEMS:  A comprehensive review of systems was negative except for: Musculoskeletal: positive for arthralgias and bone pain   FAMILY HISTORY:  No family history on file.  SOCIAL HISTORY:   Social History  Substance Use Topics  . Smoking status: Never Smoker   . Smokeless tobacco: Never Used  . Alcohol Use: No     EXAMINATION:  Vital signs in last 24 hours:    There were no vitals taken for this visit.  General Appearance:    Alert, cooperative, no distress, appears stated age  Head:    Normocephalic, without obvious abnormality, atraumatic  Eyes:    PERRL, conjunctiva/corneas clear, EOM's intact, fundi    benign, both eyes  Ears:    Normal TM's and external ear canals, both ears  Nose:   Nares normal, septum midline, mucosa normal, no drainage    or sinus tenderness  Throat:   Lips, mucosa, and tongue normal; teeth and gums normal  Neck:   Supple, symmetrical, trachea midline, no adenopathy;    thyroid:  no  enlargement/tenderness/nodules; no carotid   bruit or JVD  Back:     Symmetric, no curvature, ROM normal, no CVA tenderness  Lungs:     Clear to auscultation bilaterally, respirations unlabored  Chest Wall:    No tenderness or deformity   Heart:    Regular rate and rhythm, S1 and S2 normal, no murmur, rub   or gallop  Breast Exam:    No tenderness, masses, or nipple abnormality  Abdomen:     Soft, non-tender, bowel sounds active all four quadrants,    no masses, no organomegaly  Genitalia:    Normal female without lesion, discharge or tenderness  Rectal:    Normal tone, no masses or tenderness;   guaiac negative stool  Extremities:   Extremities normal, atraumatic, no cyanosis or edema  Pulses:   2+ and symmetric all extremities  Skin:   Skin color, texture, turgor normal, no rashes or lesions  Lymph nodes:   Cervical, supraclavicular, and axillary nodes normal  Neurologic:   CNII-XII intact, normal strength, sensation and reflexes    throughout    Musculoskeletal:  ROM 0-120, Ligaments intact,  Imaging Review Plain radiographs demonstrate severe degenerative joint disease of the left knee. The overall alignment is neutral. The bone quality appears to be excellent for age and reported activity level.  Assessment/Plan: Primary osteoarthritis, left knee   The patient history, physical examination and imaging  studies are consistent with advanced degenerative joint disease of the left knee. The patient has failed conservative treatment.  The clearance notes were reviewed.  After discussion with the patient it was felt that Total Knee Replacement was indicated. The procedure,  risks, and benefits of total knee arthroplasty were presented and reviewed. The risks including but not limited to aseptic loosening, infection, blood clots, vascular injury, stiffness, patella tracking problems complications among others were discussed. The patient acknowledged the explanation, agreed to proceed with the  plan.  Donia Ast 03/22/2016, 6:33 AM

## 2016-03-22 NOTE — Transfer of Care (Signed)
Immediate Anesthesia Transfer of Care Note  Patient: Brenda Lang  Procedure(s) Performed: Procedure(s): LEFT TOTAL KNEE ARTHROPLASTY (Left)  Patient Location: PACU  Anesthesia Type:MAC  Level of Consciousness: awake, alert , oriented and patient cooperative  Airway & Oxygen Therapy: Patient Spontanous Breathing  Post-op Assessment: Report given to RN and Post -op Vital signs reviewed and stable  Post vital signs: Reviewed and stable  Last Vitals:  Filed Vitals:   03/22/16 0908 03/22/16 1530  BP: 160/78 144/78  Pulse: 57   Temp: 36.9 C   Resp: 18     Last Pain: There were no vitals filed for this visit.    Patients Stated Pain Goal: 4 ([id number])  Complications: No apparent anesthesia complications

## 2016-03-22 NOTE — Anesthesia Postprocedure Evaluation (Signed)
Anesthesia Post Note  Patient: Brenda Lang  Procedure(s) Performed: Procedure(s) (LRB): LEFT TOTAL KNEE ARTHROPLASTY (Left)  Patient location during evaluation: PACU Anesthesia Type: General Level of consciousness: awake and alert Pain management: pain level controlled Vital Signs Assessment: post-procedure vital signs reviewed and stable Respiratory status: spontaneous breathing, nonlabored ventilation and respiratory function stable Cardiovascular status: blood pressure returned to baseline and stable Postop Assessment: no signs of nausea or vomiting Anesthetic complications: no    Last Vitals:  Filed Vitals:   03/22/16 1730 03/22/16 1739  BP:    Pulse: 55 52  Temp:    Resp: 14 15    Last Pain: There were no vitals filed for this visit.               Larkin Alfred A

## 2016-03-23 ENCOUNTER — Encounter (HOSPITAL_COMMUNITY): Payer: Self-pay | Admitting: Orthopedic Surgery

## 2016-03-23 LAB — CBC
HCT: 36 % (ref 36.0–46.0)
HEMOGLOBIN: 12.1 g/dL (ref 12.0–15.0)
MCH: 30.2 pg (ref 26.0–34.0)
MCHC: 33.6 g/dL (ref 30.0–36.0)
MCV: 89.8 fL (ref 78.0–100.0)
Platelets: 297 10*3/uL (ref 150–400)
RBC: 4.01 MIL/uL (ref 3.87–5.11)
RDW: 13.6 % (ref 11.5–15.5)
WBC: 7.1 10*3/uL (ref 4.0–10.5)

## 2016-03-23 LAB — BASIC METABOLIC PANEL
ANION GAP: 7 (ref 5–15)
BUN: 6 mg/dL (ref 6–20)
CALCIUM: 8.6 mg/dL — AB (ref 8.9–10.3)
CO2: 27 mmol/L (ref 22–32)
Chloride: 104 mmol/L (ref 101–111)
Creatinine, Ser: 1.1 mg/dL — ABNORMAL HIGH (ref 0.44–1.00)
GFR calc Af Amer: 55 mL/min — ABNORMAL LOW (ref >60)
GFR calc non Af Amer: 47 mL/min — ABNORMAL LOW (ref >60)
GLUCOSE: 103 mg/dL — AB (ref 65–99)
Potassium: 3.8 mmol/L (ref 3.5–5.1)
Sodium: 138 mmol/L (ref 135–145)

## 2016-03-23 MED ORDER — ONDANSETRON HCL 4 MG PO TABS: 4.0000 mg | ORAL_TABLET | Freq: Four times a day (QID) | ORAL | Status: DC | PRN

## 2016-03-23 MED ORDER — ASPIRIN 325 MG PO TBEC: 325.0000 mg | DELAYED_RELEASE_TABLET | Freq: Two times a day (BID) | ORAL | Status: DC

## 2016-03-23 MED ORDER — CARISOPRODOL 350 MG PO TABS: 350.0000 mg | ORAL_TABLET | Freq: Four times a day (QID) | ORAL | Status: DC

## 2016-03-23 MED ORDER — OXYCODONE HCL 5 MG PO TABS: 5.0000 mg | ORAL_TABLET | ORAL | Status: DC | PRN

## 2016-03-23 NOTE — Progress Notes (Signed)
Orthopedic Tech Progress Note Patient Details:  Brenda Lang Jun 02, 1936 [id number] Ortho visit put on cpm at 1525 Patient ID: [name], female   DOB: 16-Aug-1936, 80 y.o.   MRN: [id number]   Braulio Bosch 03/23/2016, 3:25 PM

## 2016-03-23 NOTE — Progress Notes (Signed)
SPORTS MEDICINE AND JOINT REPLACEMENT  Lara Mulch, MD    Carlyon Shadow, PA-C [place], [place], [place]  [id number]                             [phone]   PROGRESS NOTE  Subjective:  negative for Chest Pain  negative for Shortness of Breath  negative for Nausea/Vomiting   negative for Calf Pain  negative for Bowel Movement   Tolerating Diet: yes         Patient reports pain as 5 on 0-10 scale.    Objective: Vital signs in last 24 hours:   Patient Vitals for the past 24 hrs:  BP Temp Temp src Pulse Resp SpO2 Height Weight  03/23/16 0429 (!) 107/59 mmHg 98.3 F (36.8 C) - (!) 54 15 93 % - -  03/23/16 0040 (!) 115/55 mmHg 98.7 F (37.1 C) Oral (!) 55 14 94 % - -  03/22/16 2006 (!) 152/65 mmHg 98 F (36.7 C) Oral (!) 46 15 98 % - -  03/22/16 1915 (!) 150/95 mmHg - - (!) 40 16 97 % - -  03/22/16 1821 (!) 147/75 mmHg - - - 17 - - -  03/22/16 1815 (!) 73/58 mmHg - - (!) 50 16 98 % - -  03/22/16 1739 - - - (!) 52 15 96 % - -  03/22/16 1730 - - - (!) 55 14 99 % - -  03/22/16 1727 (!) 146/71 mmHg - - - - - - -  03/22/16 1715 - 97.8 F (36.6 C) - 61 17 100 % - -  03/22/16 1711 (!) 140/95 mmHg - - - - - - -  03/22/16 1700 - - - (!) 44 13 98 % - -  03/22/16 1656 (!) 125/91 mmHg - - - - - - -  03/22/16 1641 (!) 141/61 mmHg - - (!) 54 10 97 % - -  03/22/16 1626 (!) 150/65 mmHg - - (!) 53 12 99 % - -  03/22/16 1611 131/67 mmHg - - (!) 57 11 100 % - -  03/22/16 1556 (!) 122/96 mmHg - - 60 11 95 % - -  03/22/16 1545 131/84 mmHg - - 67 16 97 % - -  03/22/16 1542 - - - 69 16 99 % - -  03/22/16 1530 (!) 144/78 mmHg 97 F (36.1 C) - 79 13 99 % - -  03/22/16 0908 (!) 160/78 mmHg 98.5 F (36.9 C) Oral (!) 57 18 100 % 5\' 5"  (1.651 m) 59.875 kg (132 lb)    @flow {1959:LAST@   Intake/Output from previous day:   06/26 0701 - 06/27 0700 In: 1300 [I.V.:1300] Out: 650 [Urine:600]   Intake/Output this shift:       Intake/Output      06/26 0701 - 06/27 0700 06/27 0701 -  06/28 0700   I.V. (mL/kg) 1300 (21.7)    Total Intake(mL/kg) 1300 (21.7)    Urine (mL/kg/hr) 600    Blood 50    Total Output 650     Net +650          Urine Occurrence 4 x       LABORATORY DATA:  Recent Labs  03/23/16 0517  WBC 7.1  HGB 12.1  HCT 36.0  PLT 297    Recent Labs  03/23/16 0517  NA 138  K 3.8  CL 104  CO2 27  BUN 6  CREATININE  1.10*  GLUCOSE 103*  CALCIUM 8.6*   Lab Results  Component Value Date   INR 1.09 03/11/2016    Examination:  General appearance: alert, cooperative and no distress Extremities: extremities normal, atraumatic, no cyanosis or edema  Wound Exam: clean, dry, intact   Drainage:  None: wound tissue dry  Motor Exam: Quadriceps and Hamstrings Intact  Sensory Exam: Superficial Peroneal, Deep Peroneal and Tibial normal   Assessment:    1 Day Post-Op  Procedure(s) (LRB): LEFT TOTAL KNEE ARTHROPLASTY (Left)  ADDITIONAL DIAGNOSIS:  Active Problems:   S/P total knee replacement  Acute Blood Loss Anemia   Plan: Physical Therapy as ordered Weight Bearing as Tolerated (WBAT)  DVT Prophylaxis:  Aspirin  DISCHARGE PLAN: Home  DISCHARGE NEEDS: HHPT   Patient is doing excellent, expect D/C today         Donia Ast 03/23/2016, 7:13 AM

## 2016-03-23 NOTE — Care Management Important Message (Signed)
Important Message  Patient Details  Name: Brenda Lang MRN: [id number] Date of Birth: [birth year]   Medicare Important Message Given:  Yes    Loann Quill 03/23/2016, 8:11 AM

## 2016-03-23 NOTE — Evaluation (Signed)
Occupational Therapy Evaluation and Discharge Patient Details Name: Brenda Lang MRN: [id number] DOB: [birth year] Today's Date: 03/23/2016    History of Present Illness Pt is a 80 y/o female who presents s/p elective L TKA on 03/22/16.   Clinical Impression   Pt was independent and active prior to admission. She is moving well POD 1. Overall, performing at a supervision to modified independent level. All education completed with pt and family verbalizing understanding. Family with no other questions or concerns.    Follow Up Recommendations  No OT follow up;Supervision/Assistance - 24 hour    Equipment Recommendations  None recommended by OT    Recommendations for Other Services       Precautions / Restrictions Precautions Precautions: Fall;Knee Precaution Comments: Pt was educated on use of the bone foam and NO pillow/towel roll/ice pack under knee.  Restrictions Weight Bearing Restrictions: Yes LLE Weight Bearing: Weight bearing as tolerated      Mobility Bed Mobility Overal bed mobility: Modified Independent                Transfers Overall transfer level: Needs assistance Equipment used: Rolling walker (2 wheeled) Transfers: Sit to/from Stand Sit to Stand: Supervision         General transfer comment: for safety, no physical assist    Balance Overall balance assessment: Needs assistance Sitting-balance support: Feet supported;No upper extremity supported Sitting balance-Leahy Scale: Good     Standing balance support: No upper extremity supported;During functional activity Standing balance-Leahy Scale: Fair                              ADL Overall ADL's : Needs assistance/impaired Eating/Feeding: Independent;Bed level   Grooming: Wash/dry face;Sitting;Set up               Lower Body Dressing: Supervision/safety;Sit to/from stand Lower Body Dressing Details (indicate cue type and reason): pt able to long sit and reach her  operated leg  Toilet Transfer: Supervision/safety;Ambulation;RW       Tub/ Shower Transfer: Supervision/safety;Ambulation;3 in 1;Rolling walker   Functional mobility during ADLs: Supervision/safety;Rolling walker       Vision     Perception     Praxis      Pertinent Vitals/Pain Pain Assessment: No/denies pain Faces Pain Scale: Hurts a little bit Pain Location: Knee Pain Descriptors / Indicators: Operative site guarding Pain Intervention(s): Limited activity within patient's tolerance;Monitored during session;Repositioned     Hand Dominance     Extremity/Trunk Assessment Upper Extremity Assessment Upper Extremity Assessment: Defer to OT evaluation;Overall [facility] for tasks assessed   Lower Extremity Assessment Lower Extremity Assessment: LLE deficits/detail LLE Deficits / Details: Decreased strength and AROM consistent with above mentioned procedure.    Cervical / Trunk Assessment Cervical / Trunk Assessment: Normal   Communication Communication Communication: No difficulties   Cognition Arousal/Alertness: Awake/alert Behavior During Therapy: Impulsive Overall Cognitive Status: Within Functional Limits for tasks assessed                     General Comments       Exercises      Shoulder Instructions      Home Living Family/patient expects to be discharged to:: Private residence Living Arrangements: Spouse/significant other Available Help at Discharge: Family;Available 24 hours/day Type of Home: House Home Access: Stairs to enter [employer] of Steps: 1   Home Layout: One level     Bathroom Shower/Tub: Tub/shower unit  Bathroom Toilet: Standard Bathroom Accessibility: Yes   Home Equipment: Walker - 2 wheels;Shower seat;Bedside commode          Prior Functioning/Environment Level of Independence: Independent             OT Diagnosis: Acute pain;Generalized weakness   OT Problem List:     OT Treatment/Interventions:       OT Goals(Current goals can be found in the care plan section) Acute Rehab OT Goals Patient Stated Goal: Home this afternoon  OT Frequency:     Barriers to D/C:            Co-evaluation              End of Session Equipment Utilized During Treatment: Gait belt;Rolling walker CPM Left Knee CPM Left Knee: Off Nurse Communication:  (aware pt vomited)  Activity Tolerance: Patient tolerated treatment well (vomited upon return to room) Patient left: in bed;with call bell/phone within reach;with family/visitor present;with nursing/sitter in room   Time: [id number] OT Time Calculation (min): 19 min Charges:  OT General Charges $OT Visit: 1 Procedure OT Evaluation $OT Eval Low Complexity: 1 Procedure G-Codes:    Malka So 03/23/2016, 2:08 PM [phone]

## 2016-03-23 NOTE — Op Note (Signed)
TOTAL KNEE REPLACEMENT OPERATIVE NOTE:  03/22/2016  8:25 AM  PATIENT:  Brenda Lang  80 y.o. female  PRE-OPERATIVE DIAGNOSIS:  primary osteoarthritis left knee  POST-OPERATIVE DIAGNOSIS:  primary osteoarthritis left knee  PROCEDURE:  Procedure(s): LEFT TOTAL KNEE ARTHROPLASTY  SURGEON:  Surgeon(s): Vickey Huger, MD  PHYSICIAN ASSISTANT:  Nehemiah Massed, [facility]  ANESTHESIA:   spinal  DRAINS: Hemovac  SPECIMEN: None  COUNTS:  Correct  TOURNIQUET:   Total Tourniquet Time Documented: Thigh (Left) - 41 minutes Total: Thigh (Left) - 41 minutes   DICTATION:  Indication for procedure:    The patient is a 80 y.o. female who has failed conservative treatment for primary osteoarthritis left knee.  Informed consent was obtained prior to anesthesia. The risks versus benefits of the operation were explain and in a way the patient can, and did, understand.   On the implant demand matching protocol, this patient scored 8.  Therefore, this patient did" "did not receive a polyethylene insert with vitamin E which is a high demand implant.  Description of procedure:     The patient was taken to the operating room and placed under anesthesia.  The patient was positioned in the usual fashion taking care that all body parts were adequately padded and/or protected.  I foley catheter was not placed.  A tourniquet was applied and the leg prepped and draped in the usual sterile fashion.  The extremity was exsanguinated with the esmarch and tourniquet inflated to 350 mmHg.  Pre-operative range of motion was normal.  The knee was in 5 degree of mild valgus.  A midline incision approximately 6-7 inches long was made with a #10 blade.  A new blade was used to make a parapatellar arthrotomy going 2-3 cm into the quadriceps tendon, over the patella, and alongside the medial aspect of the patellar tendon.  A synovectomy was then performed with the #10 blade and forceps. I then elevated the deep MCL off the  medial tibial metaphysis subperiosteally around to the semimembranosus attachment.    I everted the patella and used calipers to measure patellar thickness.  I used the reamer to ream down to appropriate thickness to recreate the native thickness.  I then removed excess bone with the rongeur and sagittal saw.  I used the appropriately sized template and drilled the three lug holes.  I then put the trial in place and measured the thickness with the calipers to ensure recreation of the native thickness.  The trial was then removed and the patella subluxed and the knee brought into flexion.  A homan retractor was place to retract and protect the patella and lateral structures.  A Z-retractor was place medially to protect the medial structures.  The extra-medullary alignment system was used to make cut the tibial articular surface perpendicular to the anamotic axis of the tibia and in 3 degrees of posterior slope.  The cut surface and alignment jig was removed.  I then used the intramedullary alignment guide to make a 4 valgus cut on the distal femur.  I then marked out the epicondylar axis on the distal femur.  The posterior condylar axis measured 5 degrees.  I then used the anterior referencing sizer and measured the femur to be a size 8.  The 4-In-1 cutting block was screwed into place in external rotation matching the posterior condylar angle, making our cuts perpendicular to the epicondylar axis.  Anterior, posterior and chamfer cuts were made with the sagittal saw.  The cutting block and  cut pieces were removed.  A lamina spreader was placed in 90 degrees of flexion.  The ACL, PCL, menisci, and posterior condylar osteophytes were removed.  A 10 mm spacer blocked was found to offer good flexion and extension gap balance after minimal in degree releasing.   The scoop retractor was then placed and the femoral finishing block was pinned in place.  The small sagittal saw was used as well as the lug drill to  finish the femur.  The block and cut surfaces were removed and the medullary canal hole filled with autograft bone from the cut pieces.  The tibia was delivered forward in deep flexion and external rotation.  A size E tray was selected and pinned into place centered on the medial 1/3 of the tibial tubercle.  The reamer and keel was used to prepare the tibia through the tray.    I then trialed with the size 8 femur, size E tibia, a 10 mm insert and the 32 patella.  I had excellent flexion/extension gap balance, excellent patella tracking.  Flexion was full and beyond 120 degrees; extension was zero.  These components were chosen and the staff opened them to me on the back table while the knee was lavaged copiously and the cement mixed.  The soft tissue was infiltrated with 60cc of exparel 1.3% through a 21 gauge needle.  I cemented in the components and removed all excess cement.  The polyethylene tibial component was snapped into place and the knee placed in extension while cement was hardening.  The capsule was infilltrated with 30cc of .25% Marcaine with epinephrine.  A hemovac was place in the joint exiting superolaterally.  A pain pump was place superomedially superficial to the arthrotomy.  Once the cement was hard, the tourniquet was let down.  Hemostasis was obtained.  The arthrotomy was closed with figure-8 #1 vicryl sutures.  The deep soft tissues were closed with #0 vicryls and the subcuticular layer closed with a running #2-0 vicryl.  The skin was reapproximated and closed with skin staples.  The wound was dressed with xeroform, 4 x4's, 2 ABD sponges, a single layer of webril and a TED stocking.   The patient was then awakened, extubated, and taken to the recovery room in stable condition.  BLOOD LOSS:  300cc DRAINS: 1 hemovac, 1 pain catheter COMPLICATIONS:  None.  PLAN OF CARE: Admit to inpatient   PATIENT DISPOSITION:  PACU - hemodynamically stable.   Delay start of Pharmacological  VTE agent (>24hrs) due to surgical blood loss or risk of bleeding:  not applicable  Please fax a copy of this op note to my office at [phone] (please only include page 1 and 2 of the Case Information op note)

## 2016-03-23 NOTE — Progress Notes (Signed)
Orthopedic Tech Progress Note Patient Details:  Brenda Lang 07-11-1936 [id number]  Patient ID: [name], female   DOB: 1936/03/19, 80 y.o.   MRN: [id number] Applied cpm 0-90  Karolee Stamps 03/23/2016, 5:19 AM

## 2016-03-23 NOTE — Care Management Note (Signed)
Case Management Note  Patient Details  Name: Brenda Lang MRN: [id number] Date of Birth: 1935/10/09  Subjective/Objective:   80 yr old female s/p left total knee arthroplasty.                 Action/Plan: Patient was preoperatively setup with [facility]. No changes. Patient will have family support at discharge.    Expected Discharge Date:    03/24/16              Expected Discharge Plan:  [place]  In-House Referral:     Discharge planning Services  CM Consult  Post Acute Care Choice:  Home Health Choice offered to:  Patient  DME Arranged:    DME Agency:     HH Arranged:  PT [place]:  [facility] (now [facility])  Status of Service:  Completed, signed off  If discussed at [employer] of Stay Meetings, dates discussed:    Additional Comments:  Ninfa Meeker, RN 03/23/2016, 11:53 AM

## 2016-03-23 NOTE — Evaluation (Signed)
Physical Therapy Evaluation Patient Details Name: Brenda Lang MRN: [id number] DOB: 1936-05-26 Today's Date: 03/23/2016   History of Present Illness  Pt is a 80 y/o female who presents s/p elective L TKA on 03/22/16.  Clinical Impression  Pt admitted with above diagnosis. Pt currently with functional limitations due to the deficits listed below (see PT Problem List). At the time of PT eval pt was able to perform transfers and ambulation with gross supervision for safety. Pt will benefit from skilled PT to increase their independence and safety with mobility to allow discharge to the venue listed below.       Follow Up Recommendations Home health PT;Supervision for mobility/OOB    Equipment Recommendations  None recommended by PT    Recommendations for Other Services       Precautions / Restrictions Precautions Precautions: Fall;Knee Precaution Comments: Pt was educated on use of the bone foam and NO pillow/towel roll/ice pack under knee.  Restrictions Weight Bearing Restrictions: Yes LLE Weight Bearing: Weight bearing as tolerated      Mobility  Bed Mobility Overal bed mobility: Modified Independent                Transfers Overall transfer level: Needs assistance Equipment used: Rolling walker (2 wheeled) Transfers: Sit to/from Stand Sit to Stand: Supervision         General transfer comment: Light supervision for safety. Pt was able to power-up to full stand without assistance or any noted unsteadiness.   Ambulation/Gait Ambulation/Gait assistance: Supervision Ambulation Distance (Feet): 250 Feet Assistive device: Rolling walker (2 wheeled) Gait Pattern/deviations: Step-through pattern;Decreased stride length Gait velocity: Decreased Gait velocity interpretation: Below normal speed for age/gender General Gait Details: VC's for improved posture, sequencing and general safety with the RW.   Stairs            Wheelchair Mobility    Modified  [place] (Stroke Patients Only)       Balance Overall balance assessment: Needs assistance Sitting-balance support: Feet supported;No upper extremity supported Sitting balance-Leahy Scale: Good     Standing balance support: No upper extremity supported;During functional activity Standing balance-Leahy Scale: Fair                               Pertinent Vitals/Pain Pain Assessment: Faces Faces Pain Scale: Hurts a little bit Pain Location: Knee Pain Descriptors / Indicators: Operative site guarding Pain Intervention(s): Limited activity within patient's tolerance;Monitored during session;Repositioned    Home Living Family/patient expects to be discharged to:: Private residence Living Arrangements: Spouse/significant other Available Help at Discharge: Family;Available 24 hours/day Type of Home: House Home Access: Stairs to enter   [employer] of Steps: 1 Home Layout: One level Home Equipment: Walker - 2 wheels;Shower seat;Bedside commode      Prior Function Level of Independence: Independent               Hand Dominance        Extremity/Trunk Assessment   Upper Extremity Assessment: Defer to OT evaluation;Overall [facility] for tasks assessed           Lower Extremity Assessment: LLE deficits/detail   LLE Deficits / Details: Decreased strength and AROM consistent with above mentioned procedure.   Cervical / Trunk Assessment: Normal  Communication   Communication: No difficulties  Cognition Arousal/Alertness: Awake/alert Behavior During Therapy: WFL for tasks assessed/performed Overall Cognitive Status: Within Functional Limits for tasks assessed  General Comments      Exercises Total Joint Exercises Ankle Circles/Pumps: 10 reps Quad Sets: 10 reps Hip ABduction/ADduction: 10 reps Straight Leg Raises: 10 reps Long Arc Quad: 10 reps Knee Flexion: 5 reps (stretching - 20-30" hold) Goniometric ROM: 97  AROM in sitting      Assessment/Plan    PT Assessment Patient needs continued PT services  PT Diagnosis Difficulty walking;Acute pain   PT Problem List Decreased strength;Decreased range of motion;Decreased activity tolerance;Decreased balance;Decreased mobility;Decreased knowledge of use of DME;Decreased safety awareness;Decreased knowledge of precautions;Pain  PT Treatment Interventions DME instruction;Gait training;Stair training;Functional mobility training;Therapeutic activities;Therapeutic exercise;Neuromuscular re-education;Patient/family education   PT Goals (Current goals can be found in the Care Plan section) Acute Rehab PT Goals Patient Stated Goal: Home this afternoon PT Goal Formulation: With patient Time For Goal Achievement: 03/26/16 Potential to Achieve Goals: Good    Frequency 7X/week   Barriers to discharge        Co-evaluation               End of Session Equipment Utilized During Treatment: Gait belt Activity Tolerance: Patient tolerated treatment well Patient left: in chair;with call bell/phone within reach;with family/visitor present Nurse Communication: Mobility status         Time: [id number] PT Time Calculation (min) (ACUTE ONLY): 28 min   Charges:   PT Evaluation $PT Eval Moderate Complexity: 1 Procedure PT Treatments $Gait Training: 8-22 mins   PT G Codes:        Rolinda Roan April 13, 2016, 12:23 PM   Rolinda Roan, PT, DPT Acute Rehabilitation Services Pager: [phone]

## 2016-03-23 NOTE — Progress Notes (Signed)
Pt ready for discharge. Education/instructions reviewed with pt and family, and all questions/concerns addressed. IV removed and belongings gathered. PT will be transported out via wheelchair to family member's car. Will continue to monitor

## 2016-03-23 NOTE — Progress Notes (Signed)
Physical Therapy Progress Note    03/23/16 1520  PT Visit Information  Last PT Received On 03/23/16  Ambulation/Gait  Stairs Yes  Stair Management No rails;Backwards;With walker  Number of Stairs 1  General stair comments Pt was instructed in negotiation of 1 stair with RW for support by Governor Rooks, PTA at the end of OT session. Per PTA, pt performed well and does not require any further instruction.    This PT answered all of pt and family's questions after stair training. Will continue to follow.   Rolinda Roan, PT, DPT Acute Rehabilitation Services Pager: [phone]

## 2016-03-23 NOTE — Discharge Summary (Signed)
SPORTS MEDICINE & JOINT REPLACEMENT   Lara Mulch, MD   Carlyon Shadow, PA-C [address], [place], [place]  [address]                             [phone]  PATIENT ID: Brenda Lang        MRN:  [id number]          DOB/AGE: 1936-09-23 / 80 y.o.    DISCHARGE SUMMARY  ADMISSION DATE:    03/22/2016 DISCHARGE DATE:   03/23/2016   ADMISSION DIAGNOSIS: primary osteoarthritis left knee    DISCHARGE DIAGNOSIS:  primary osteoarthritis left knee    ADDITIONAL DIAGNOSIS: Active Problems:   S/P total knee replacement  Past Medical History  Diagnosis Date  . Knee pain   . Heart murmur   . Arthritis     PROCEDURE: Procedure(s): LEFT TOTAL KNEE ARTHROPLASTY on 03/22/2016  CONSULTS:     HISTORY:  See H&P in chart  HOSPITAL COURSE:  RAQUELLE Lang is a 80 y.o. admitted on 03/22/2016 and found to have a diagnosis of primary osteoarthritis left knee.  After appropriate laboratory studies were obtained  they were taken to the operating room on 03/22/2016 and underwent Procedure(s): LEFT TOTAL KNEE ARTHROPLASTY.   They were given perioperative antibiotics:  Anti-infectives    Start     Dose/Rate Route Frequency Ordered Stop   03/22/16 2100  ceFAZolin (ANCEF) IVPB 2g/100 mL premix     2 g 200 mL/hr over 30 Minutes Intravenous Every 8 hours 03/22/16 1959 03/23/16 0724   03/22/16 1030  ceFAZolin (ANCEF) IVPB 2g/100 mL premix     2 g 200 mL/hr over 30 Minutes Intravenous To ShortStay Surgical 03/19/16 1050 03/22/16 1326   03/22/16 0930  ciprofloxacin (CIPRO) IVPB 400 mg     400 mg 200 mL/hr over 60 Minutes Intravenous  Once 03/22/16 0928 03/22/16 1040    .  Patient given tranexamic acid IV or topical and exparel intra-operatively.  Tolerated the procedure well.    POD# 1: Vital signs were stable.  Patient denied Chest pain, shortness of breath, or calf pain.  Patient was started on Lovenox 30 mg subcutaneously twice daily at 8am.  Consults to PT, OT, and care management  were made.  The patient was weight bearing as tolerated.  CPM was placed on the operative leg 0-90 degrees for 6-8 hours a day. When out of the CPM, patient was placed in the foam block to achieve full extension. Incentive spirometry was taught.  Dressing was changed.       POD #2, Continued  PT for ambulation and exercise program.  IV saline locked.  O2 discontinued.    The remainder of the hospital course was dedicated to ambulation and strengthening.   The patient was discharged on 1 Day Post-Op in  Good condition.  Blood products given:none  DIAGNOSTIC STUDIES: Recent vital signs: Patient Vitals for the past 24 hrs:  BP Temp Temp src Pulse Resp SpO2  03/23/16 0429 (!) 107/59 mmHg 98.3 F (36.8 C) - (!) 54 15 93 %  03/23/16 0040 (!) 115/55 mmHg 98.7 F (37.1 C) Oral (!) 55 14 94 %  03/22/16 2006 (!) 152/65 mmHg 98 F (36.7 C) Oral (!) 46 15 98 %  03/22/16 1915 (!) 150/95 mmHg - - (!) 40 16 97 %  03/22/16 1821 (!) 147/75 mmHg - - - 17 -  03/22/16 1815 (!) 73/58 mmHg - - Marland Kitchen)  50 16 98 %  03/22/16 1739 - - - (!) 52 15 96 %  03/22/16 1730 - - - (!) 55 14 99 %  03/22/16 1727 (!) 146/71 mmHg - - - - -  03/22/16 1715 - 97.8 F (36.6 C) - 61 17 100 %  03/22/16 1711 (!) 140/95 mmHg - - - - -  03/22/16 1700 - - - (!) 44 13 98 %  03/22/16 1656 (!) 125/91 mmHg - - - - -  03/22/16 1641 (!) 141/61 mmHg - - (!) 54 10 97 %  03/22/16 1626 (!) 150/65 mmHg - - (!) 53 12 99 %  03/22/16 1611 131/67 mmHg - - (!) 57 11 100 %  03/22/16 1556 (!) 122/96 mmHg - - 60 11 95 %  03/22/16 1545 131/84 mmHg - - 67 16 97 %  03/22/16 1542 - - - 69 16 99 %  03/22/16 1530 (!) 144/78 mmHg 97 F (36.1 C) - 79 13 99 %       Recent laboratory studies:  Recent Labs  03/23/16 0517  WBC 7.1  HGB 12.1  HCT 36.0  PLT 297    Recent Labs  03/23/16 0517  NA 138  K 3.8  CL 104  CO2 27  BUN 6  CREATININE 1.10*  GLUCOSE 103*  CALCIUM 8.6*   Lab Results  Component Value Date   INR 1.09 03/11/2016      Recent Radiographic Studies :  Dg Chest 2 View  03/11/2016  CLINICAL DATA:  Preop for total left knee arthroplasty on June 26. History of heart murmur, arthritis. EXAM: CHEST  2 VIEW COMPARISON:  None. FINDINGS: Heart size is normal. Overall cardiomediastinal silhouette is within normal limits in size and configuration. Probable mild scarring/fibrosis within the lower lobes bilaterally. No confluent opacity to suggest a developing pneumonia. No pleural effusion. Osseous structures about the chest are unremarkable. IMPRESSION: No active cardiopulmonary disease. Electronically Signed   By: Franki Cabot M.D.   On: 03/11/2016 11:45    DISCHARGE INSTRUCTIONS:   DISCHARGE MEDICATIONS:     Medication List    TAKE these medications        aspirin 325 MG EC tablet  Take 1 tablet (325 mg total) by mouth 2 (two) times daily.     CALCIUM 600+D 600-800 MG-UNIT Tabs  Generic drug:  Calcium Carb-Cholecalciferol  Take 1 tablet by mouth daily.     carisoprodol 350 MG tablet  Commonly known as:  SOMA  Take 1 tablet (350 mg total) by mouth 4 (four) times daily.     Fish Oil 1000 MG Caps  Take 1 capsule by mouth 2 (two) times daily.     glucosamine-chondroitin 500-400 MG tablet  Take 1 tablet by mouth 2 (two) times daily.     ondansetron 4 MG tablet  Commonly known as:  ZOFRAN  Take 1 tablet (4 mg total) by mouth every 6 (six) hours as needed for nausea.     oxyCODONE 5 MG immediate release tablet  Commonly known as:  Oxy IR/ROXICODONE  Take 1-2 tablets (5-10 mg total) by mouth every 4 (four) hours as needed for breakthrough pain.     potassium gluconate 595 (99 K) MG Tabs tablet  Take 595 mg by mouth daily as needed (for supplementation).     VIACTIV [id number] MG-UNT-MCG Chew  Generic drug:  Calcium-Vitamin D-Vitamin K  Chew 1 each by mouth daily. Reported on 03/08/2016     vitamin B-12 1000 MCG tablet  Commonly known as:  CYANOCOBALAMIN  Take 1,000 mcg by mouth once a week.      Vitamin D3 5000 units Tabs  Take 5,000 Units by mouth daily as needed (for supplementation).        FOLLOW UP VISIT:       Follow-up Information    Follow up with [facility].   Why:  Someone from [place] [facility]), will contact you to arrange start date and time for therapy.   Contact information:   [address] [place] [address] [phone]       DISPOSITION: HOME VS. SNF  CONDITION:  Good   Donia Ast 03/23/2016, 3:13 PM

## 2016-03-24 DIAGNOSIS — Z471 Aftercare following joint replacement surgery: Secondary | ICD-10-CM | POA: Diagnosis not present

## 2016-03-24 DIAGNOSIS — Z96652 Presence of left artificial knee joint: Secondary | ICD-10-CM | POA: Diagnosis not present

## 2016-03-25 DIAGNOSIS — Z471 Aftercare following joint replacement surgery: Secondary | ICD-10-CM | POA: Diagnosis not present

## 2016-03-25 DIAGNOSIS — Z96652 Presence of left artificial knee joint: Secondary | ICD-10-CM | POA: Diagnosis not present

## 2016-03-26 DIAGNOSIS — Z96652 Presence of left artificial knee joint: Secondary | ICD-10-CM | POA: Diagnosis not present

## 2016-03-26 DIAGNOSIS — Z471 Aftercare following joint replacement surgery: Secondary | ICD-10-CM | POA: Diagnosis not present

## 2016-03-27 DIAGNOSIS — Z471 Aftercare following joint replacement surgery: Secondary | ICD-10-CM | POA: Diagnosis not present

## 2016-03-27 DIAGNOSIS — Z96652 Presence of left artificial knee joint: Secondary | ICD-10-CM | POA: Diagnosis not present

## 2016-03-29 DIAGNOSIS — Z96652 Presence of left artificial knee joint: Secondary | ICD-10-CM | POA: Diagnosis not present

## 2016-03-29 DIAGNOSIS — Z471 Aftercare following joint replacement surgery: Secondary | ICD-10-CM | POA: Diagnosis not present

## 2016-03-31 DIAGNOSIS — Z471 Aftercare following joint replacement surgery: Secondary | ICD-10-CM | POA: Diagnosis not present

## 2016-03-31 DIAGNOSIS — Z96652 Presence of left artificial knee joint: Secondary | ICD-10-CM | POA: Diagnosis not present

## 2016-04-02 DIAGNOSIS — Z96652 Presence of left artificial knee joint: Secondary | ICD-10-CM | POA: Diagnosis not present

## 2016-04-02 DIAGNOSIS — Z471 Aftercare following joint replacement surgery: Secondary | ICD-10-CM | POA: Diagnosis not present

## 2016-04-05 DIAGNOSIS — Z471 Aftercare following joint replacement surgery: Secondary | ICD-10-CM | POA: Diagnosis not present

## 2016-04-05 DIAGNOSIS — Z96652 Presence of left artificial knee joint: Secondary | ICD-10-CM | POA: Diagnosis not present

## 2016-04-06 DIAGNOSIS — M25562 Pain in left knee: Secondary | ICD-10-CM | POA: Diagnosis not present

## 2016-04-06 DIAGNOSIS — Z96652 Presence of left artificial knee joint: Secondary | ICD-10-CM | POA: Diagnosis not present

## 2016-04-07 DIAGNOSIS — Z96652 Presence of left artificial knee joint: Secondary | ICD-10-CM | POA: Diagnosis not present

## 2016-04-07 DIAGNOSIS — M62552 Muscle wasting and atrophy, not elsewhere classified, left thigh: Secondary | ICD-10-CM | POA: Diagnosis not present

## 2016-04-07 DIAGNOSIS — M25562 Pain in left knee: Secondary | ICD-10-CM | POA: Diagnosis not present

## 2016-04-07 DIAGNOSIS — R2689 Other abnormalities of gait and mobility: Secondary | ICD-10-CM | POA: Diagnosis not present

## 2016-04-09 DIAGNOSIS — M62552 Muscle wasting and atrophy, not elsewhere classified, left thigh: Secondary | ICD-10-CM | POA: Diagnosis not present

## 2016-04-09 DIAGNOSIS — M25562 Pain in left knee: Secondary | ICD-10-CM | POA: Diagnosis not present

## 2016-04-09 DIAGNOSIS — Z96652 Presence of left artificial knee joint: Secondary | ICD-10-CM | POA: Diagnosis not present

## 2016-04-09 DIAGNOSIS — R2689 Other abnormalities of gait and mobility: Secondary | ICD-10-CM | POA: Diagnosis not present

## 2016-04-13 DIAGNOSIS — M62552 Muscle wasting and atrophy, not elsewhere classified, left thigh: Secondary | ICD-10-CM | POA: Diagnosis not present

## 2016-04-13 DIAGNOSIS — R2689 Other abnormalities of gait and mobility: Secondary | ICD-10-CM | POA: Diagnosis not present

## 2016-04-13 DIAGNOSIS — M25562 Pain in left knee: Secondary | ICD-10-CM | POA: Diagnosis not present

## 2016-04-13 DIAGNOSIS — Z96652 Presence of left artificial knee joint: Secondary | ICD-10-CM | POA: Diagnosis not present

## 2016-04-15 DIAGNOSIS — R2689 Other abnormalities of gait and mobility: Secondary | ICD-10-CM | POA: Diagnosis not present

## 2016-04-15 DIAGNOSIS — Z96652 Presence of left artificial knee joint: Secondary | ICD-10-CM | POA: Diagnosis not present

## 2016-04-15 DIAGNOSIS — M62552 Muscle wasting and atrophy, not elsewhere classified, left thigh: Secondary | ICD-10-CM | POA: Diagnosis not present

## 2016-04-15 DIAGNOSIS — M25562 Pain in left knee: Secondary | ICD-10-CM | POA: Diagnosis not present

## 2016-04-16 DIAGNOSIS — F418 Other specified anxiety disorders: Secondary | ICD-10-CM | POA: Diagnosis not present

## 2016-04-16 DIAGNOSIS — N3000 Acute cystitis without hematuria: Secondary | ICD-10-CM | POA: Diagnosis not present

## 2016-04-17 DIAGNOSIS — Z96652 Presence of left artificial knee joint: Secondary | ICD-10-CM | POA: Diagnosis not present

## 2016-04-17 DIAGNOSIS — R079 Chest pain, unspecified: Secondary | ICD-10-CM | POA: Diagnosis not present

## 2016-04-17 DIAGNOSIS — E0789 Other specified disorders of thyroid: Secondary | ICD-10-CM | POA: Diagnosis not present

## 2016-04-17 DIAGNOSIS — R0602 Shortness of breath: Secondary | ICD-10-CM | POA: Diagnosis not present

## 2016-04-17 DIAGNOSIS — R072 Precordial pain: Secondary | ICD-10-CM | POA: Diagnosis not present

## 2016-04-17 DIAGNOSIS — N390 Urinary tract infection, site not specified: Secondary | ICD-10-CM | POA: Diagnosis not present

## 2016-04-17 DIAGNOSIS — E041 Nontoxic single thyroid nodule: Secondary | ICD-10-CM | POA: Diagnosis not present

## 2016-04-17 DIAGNOSIS — R0789 Other chest pain: Secondary | ICD-10-CM | POA: Diagnosis not present

## 2016-04-17 DIAGNOSIS — R001 Bradycardia, unspecified: Secondary | ICD-10-CM | POA: Diagnosis not present

## 2016-04-18 DIAGNOSIS — R001 Bradycardia, unspecified: Secondary | ICD-10-CM | POA: Diagnosis not present

## 2016-04-18 DIAGNOSIS — R0789 Other chest pain: Secondary | ICD-10-CM | POA: Diagnosis not present

## 2016-04-18 DIAGNOSIS — R079 Chest pain, unspecified: Secondary | ICD-10-CM | POA: Diagnosis not present

## 2016-04-20 DIAGNOSIS — R2689 Other abnormalities of gait and mobility: Secondary | ICD-10-CM | POA: Diagnosis not present

## 2016-04-20 DIAGNOSIS — Z96652 Presence of left artificial knee joint: Secondary | ICD-10-CM | POA: Diagnosis not present

## 2016-04-20 DIAGNOSIS — M62552 Muscle wasting and atrophy, not elsewhere classified, left thigh: Secondary | ICD-10-CM | POA: Diagnosis not present

## 2016-04-20 DIAGNOSIS — M25562 Pain in left knee: Secondary | ICD-10-CM | POA: Diagnosis not present

## 2016-04-22 DIAGNOSIS — M25562 Pain in left knee: Secondary | ICD-10-CM | POA: Diagnosis not present

## 2016-04-22 DIAGNOSIS — R2689 Other abnormalities of gait and mobility: Secondary | ICD-10-CM | POA: Diagnosis not present

## 2016-04-22 DIAGNOSIS — M62552 Muscle wasting and atrophy, not elsewhere classified, left thigh: Secondary | ICD-10-CM | POA: Diagnosis not present

## 2016-04-22 DIAGNOSIS — Z96652 Presence of left artificial knee joint: Secondary | ICD-10-CM | POA: Diagnosis not present

## 2016-04-27 DIAGNOSIS — R2689 Other abnormalities of gait and mobility: Secondary | ICD-10-CM | POA: Diagnosis not present

## 2016-04-27 DIAGNOSIS — M25562 Pain in left knee: Secondary | ICD-10-CM | POA: Diagnosis not present

## 2016-04-27 DIAGNOSIS — Z96652 Presence of left artificial knee joint: Secondary | ICD-10-CM | POA: Diagnosis not present

## 2016-04-27 DIAGNOSIS — M62552 Muscle wasting and atrophy, not elsewhere classified, left thigh: Secondary | ICD-10-CM | POA: Diagnosis not present

## 2016-04-28 DIAGNOSIS — R079 Chest pain, unspecified: Secondary | ICD-10-CM | POA: Diagnosis not present

## 2016-04-28 DIAGNOSIS — E049 Nontoxic goiter, unspecified: Secondary | ICD-10-CM | POA: Diagnosis not present

## 2016-04-29 DIAGNOSIS — M62552 Muscle wasting and atrophy, not elsewhere classified, left thigh: Secondary | ICD-10-CM | POA: Diagnosis not present

## 2016-04-29 DIAGNOSIS — M25562 Pain in left knee: Secondary | ICD-10-CM | POA: Diagnosis not present

## 2016-04-29 DIAGNOSIS — Z96652 Presence of left artificial knee joint: Secondary | ICD-10-CM | POA: Diagnosis not present

## 2016-04-29 DIAGNOSIS — R2689 Other abnormalities of gait and mobility: Secondary | ICD-10-CM | POA: Diagnosis not present

## 2016-05-03 DIAGNOSIS — R221 Localized swelling, mass and lump, neck: Secondary | ICD-10-CM | POA: Diagnosis not present

## 2016-05-03 DIAGNOSIS — E049 Nontoxic goiter, unspecified: Secondary | ICD-10-CM | POA: Diagnosis not present

## 2016-05-24 DIAGNOSIS — R079 Chest pain, unspecified: Secondary | ICD-10-CM | POA: Insufficient documentation

## 2016-05-24 DIAGNOSIS — R911 Solitary pulmonary nodule: Secondary | ICD-10-CM | POA: Insufficient documentation

## 2016-05-24 DIAGNOSIS — E041 Nontoxic single thyroid nodule: Secondary | ICD-10-CM | POA: Insufficient documentation

## 2016-05-31 DIAGNOSIS — N3000 Acute cystitis without hematuria: Secondary | ICD-10-CM | POA: Diagnosis not present

## 2016-05-31 DIAGNOSIS — N3091 Cystitis, unspecified with hematuria: Secondary | ICD-10-CM | POA: Diagnosis not present

## 2016-06-18 DIAGNOSIS — N3090 Cystitis, unspecified without hematuria: Secondary | ICD-10-CM | POA: Diagnosis not present

## 2016-06-18 DIAGNOSIS — R300 Dysuria: Secondary | ICD-10-CM | POA: Diagnosis not present

## 2017-01-04 DIAGNOSIS — S91309A Unspecified open wound, unspecified foot, initial encounter: Secondary | ICD-10-CM | POA: Diagnosis not present

## 2017-05-16 DIAGNOSIS — Z1211 Encounter for screening for malignant neoplasm of colon: Secondary | ICD-10-CM | POA: Diagnosis not present

## 2017-05-16 DIAGNOSIS — E7800 Pure hypercholesterolemia, unspecified: Secondary | ICD-10-CM | POA: Diagnosis not present

## 2017-05-16 DIAGNOSIS — E042 Nontoxic multinodular goiter: Secondary | ICD-10-CM | POA: Diagnosis not present

## 2017-05-16 DIAGNOSIS — Z86010 Personal history of colonic polyps: Secondary | ICD-10-CM | POA: Diagnosis not present

## 2017-05-16 DIAGNOSIS — Z79899 Other long term (current) drug therapy: Secondary | ICD-10-CM | POA: Diagnosis not present

## 2017-05-16 DIAGNOSIS — M810 Age-related osteoporosis without current pathological fracture: Secondary | ICD-10-CM | POA: Diagnosis not present

## 2017-05-16 DIAGNOSIS — Z0000 Encounter for general adult medical examination without abnormal findings: Secondary | ICD-10-CM | POA: Diagnosis not present

## 2017-05-26 DIAGNOSIS — N3000 Acute cystitis without hematuria: Secondary | ICD-10-CM | POA: Diagnosis not present

## 2017-06-01 DIAGNOSIS — E042 Nontoxic multinodular goiter: Secondary | ICD-10-CM | POA: Diagnosis not present

## 2017-06-01 DIAGNOSIS — E041 Nontoxic single thyroid nodule: Secondary | ICD-10-CM | POA: Diagnosis not present

## 2017-06-01 DIAGNOSIS — M810 Age-related osteoporosis without current pathological fracture: Secondary | ICD-10-CM | POA: Diagnosis not present

## 2017-06-14 DIAGNOSIS — E041 Nontoxic single thyroid nodule: Secondary | ICD-10-CM | POA: Diagnosis not present

## 2017-06-24 DIAGNOSIS — E041 Nontoxic single thyroid nodule: Secondary | ICD-10-CM | POA: Diagnosis not present

## 2017-07-21 DIAGNOSIS — N3000 Acute cystitis without hematuria: Secondary | ICD-10-CM | POA: Diagnosis not present

## 2017-07-27 DIAGNOSIS — R109 Unspecified abdominal pain: Secondary | ICD-10-CM | POA: Diagnosis not present

## 2017-07-27 DIAGNOSIS — G894 Chronic pain syndrome: Secondary | ICD-10-CM | POA: Diagnosis not present

## 2017-07-27 DIAGNOSIS — R197 Diarrhea, unspecified: Secondary | ICD-10-CM | POA: Diagnosis not present

## 2017-07-27 DIAGNOSIS — Z792 Long term (current) use of antibiotics: Secondary | ICD-10-CM | POA: Diagnosis not present

## 2017-07-27 DIAGNOSIS — K625 Hemorrhage of anus and rectum: Secondary | ICD-10-CM | POA: Diagnosis not present

## 2017-07-27 DIAGNOSIS — K591 Functional diarrhea: Secondary | ICD-10-CM | POA: Diagnosis not present

## 2017-07-27 DIAGNOSIS — R112 Nausea with vomiting, unspecified: Secondary | ICD-10-CM | POA: Diagnosis not present

## 2017-07-27 DIAGNOSIS — E860 Dehydration: Secondary | ICD-10-CM | POA: Diagnosis not present

## 2017-07-27 DIAGNOSIS — M545 Low back pain: Secondary | ICD-10-CM | POA: Diagnosis not present

## 2017-07-27 DIAGNOSIS — R782 Finding of cocaine in blood: Secondary | ICD-10-CM | POA: Diagnosis not present

## 2017-09-19 DIAGNOSIS — N3000 Acute cystitis without hematuria: Secondary | ICD-10-CM | POA: Diagnosis not present

## 2017-11-17 DIAGNOSIS — N3000 Acute cystitis without hematuria: Secondary | ICD-10-CM | POA: Diagnosis not present

## 2017-11-29 DIAGNOSIS — N318 Other neuromuscular dysfunction of bladder: Secondary | ICD-10-CM | POA: Diagnosis not present

## 2017-11-29 DIAGNOSIS — N3020 Other chronic cystitis without hematuria: Secondary | ICD-10-CM | POA: Diagnosis not present

## 2017-12-12 IMAGING — CR DG CHEST 2V
2 series · 2 of 2 positions shown · non-contrast
Comparison: None.

CLINICAL DATA: Preop for total left knee arthroplasty on [DATE].
History of heart murmur, arthritis.

EXAM:
CHEST  2 VIEW

[w chest pa]
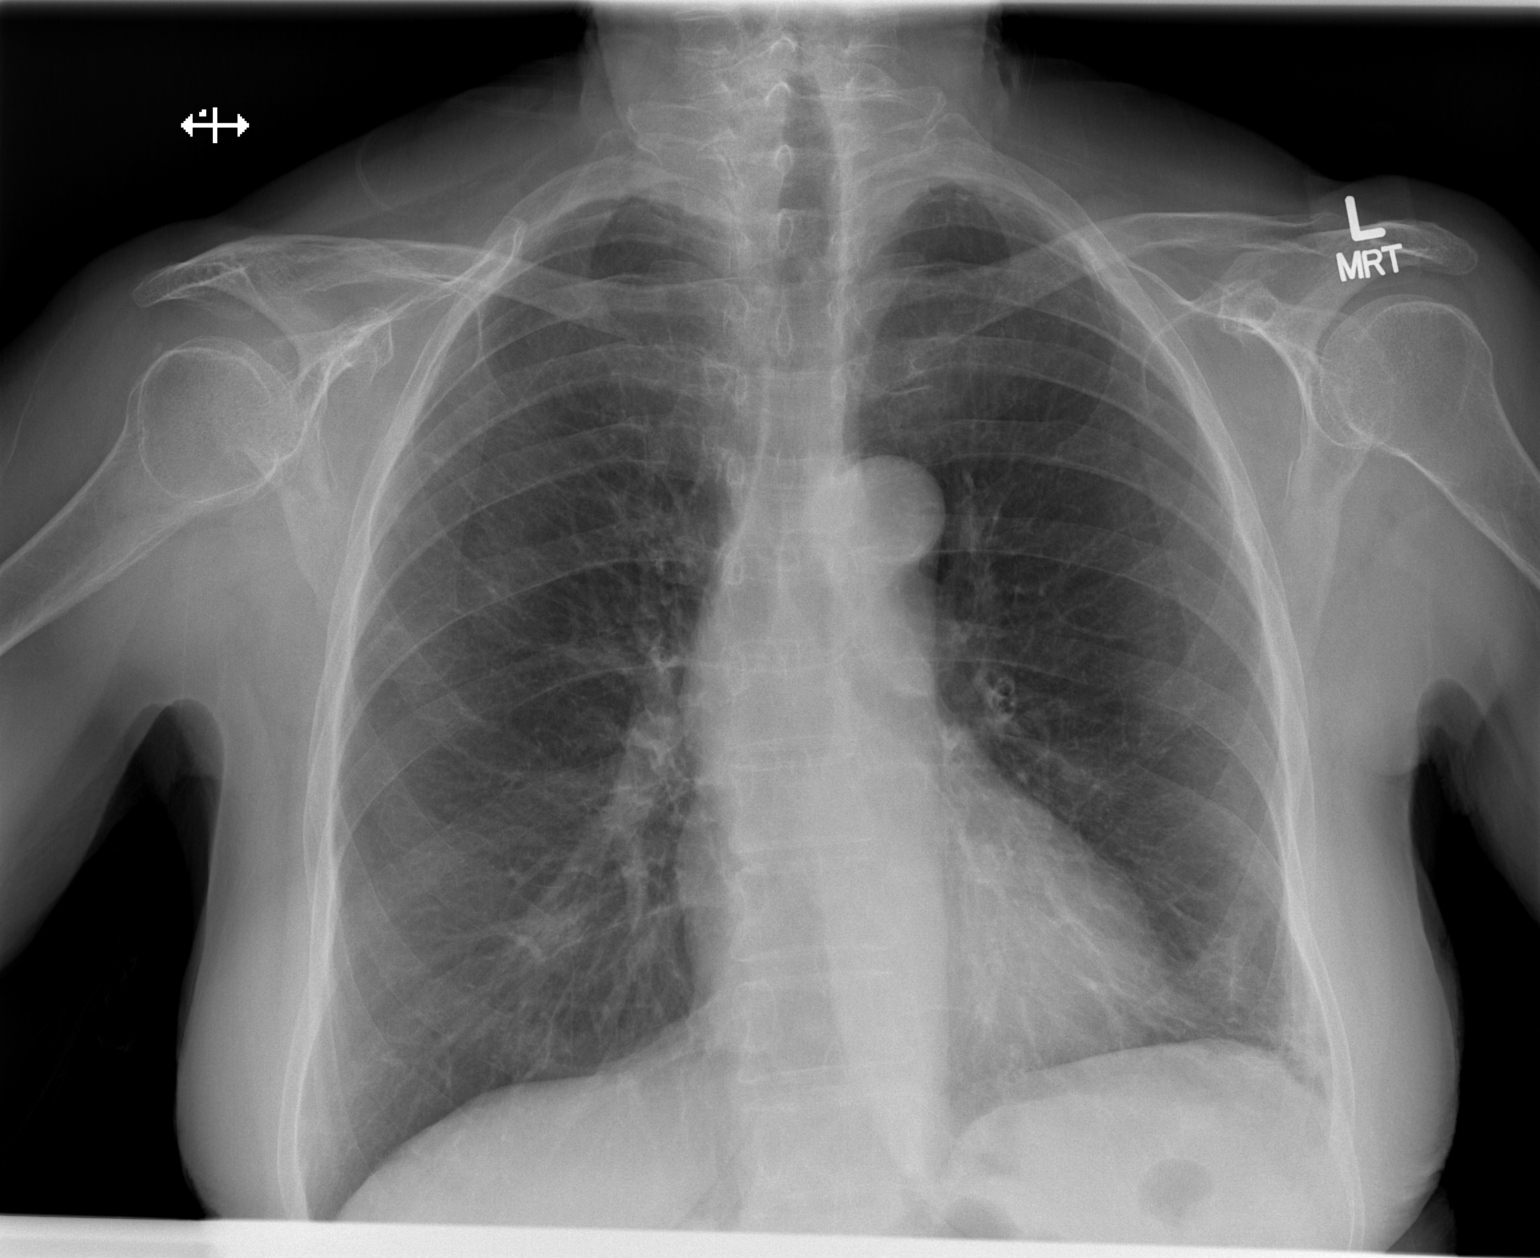

[w chest lat]
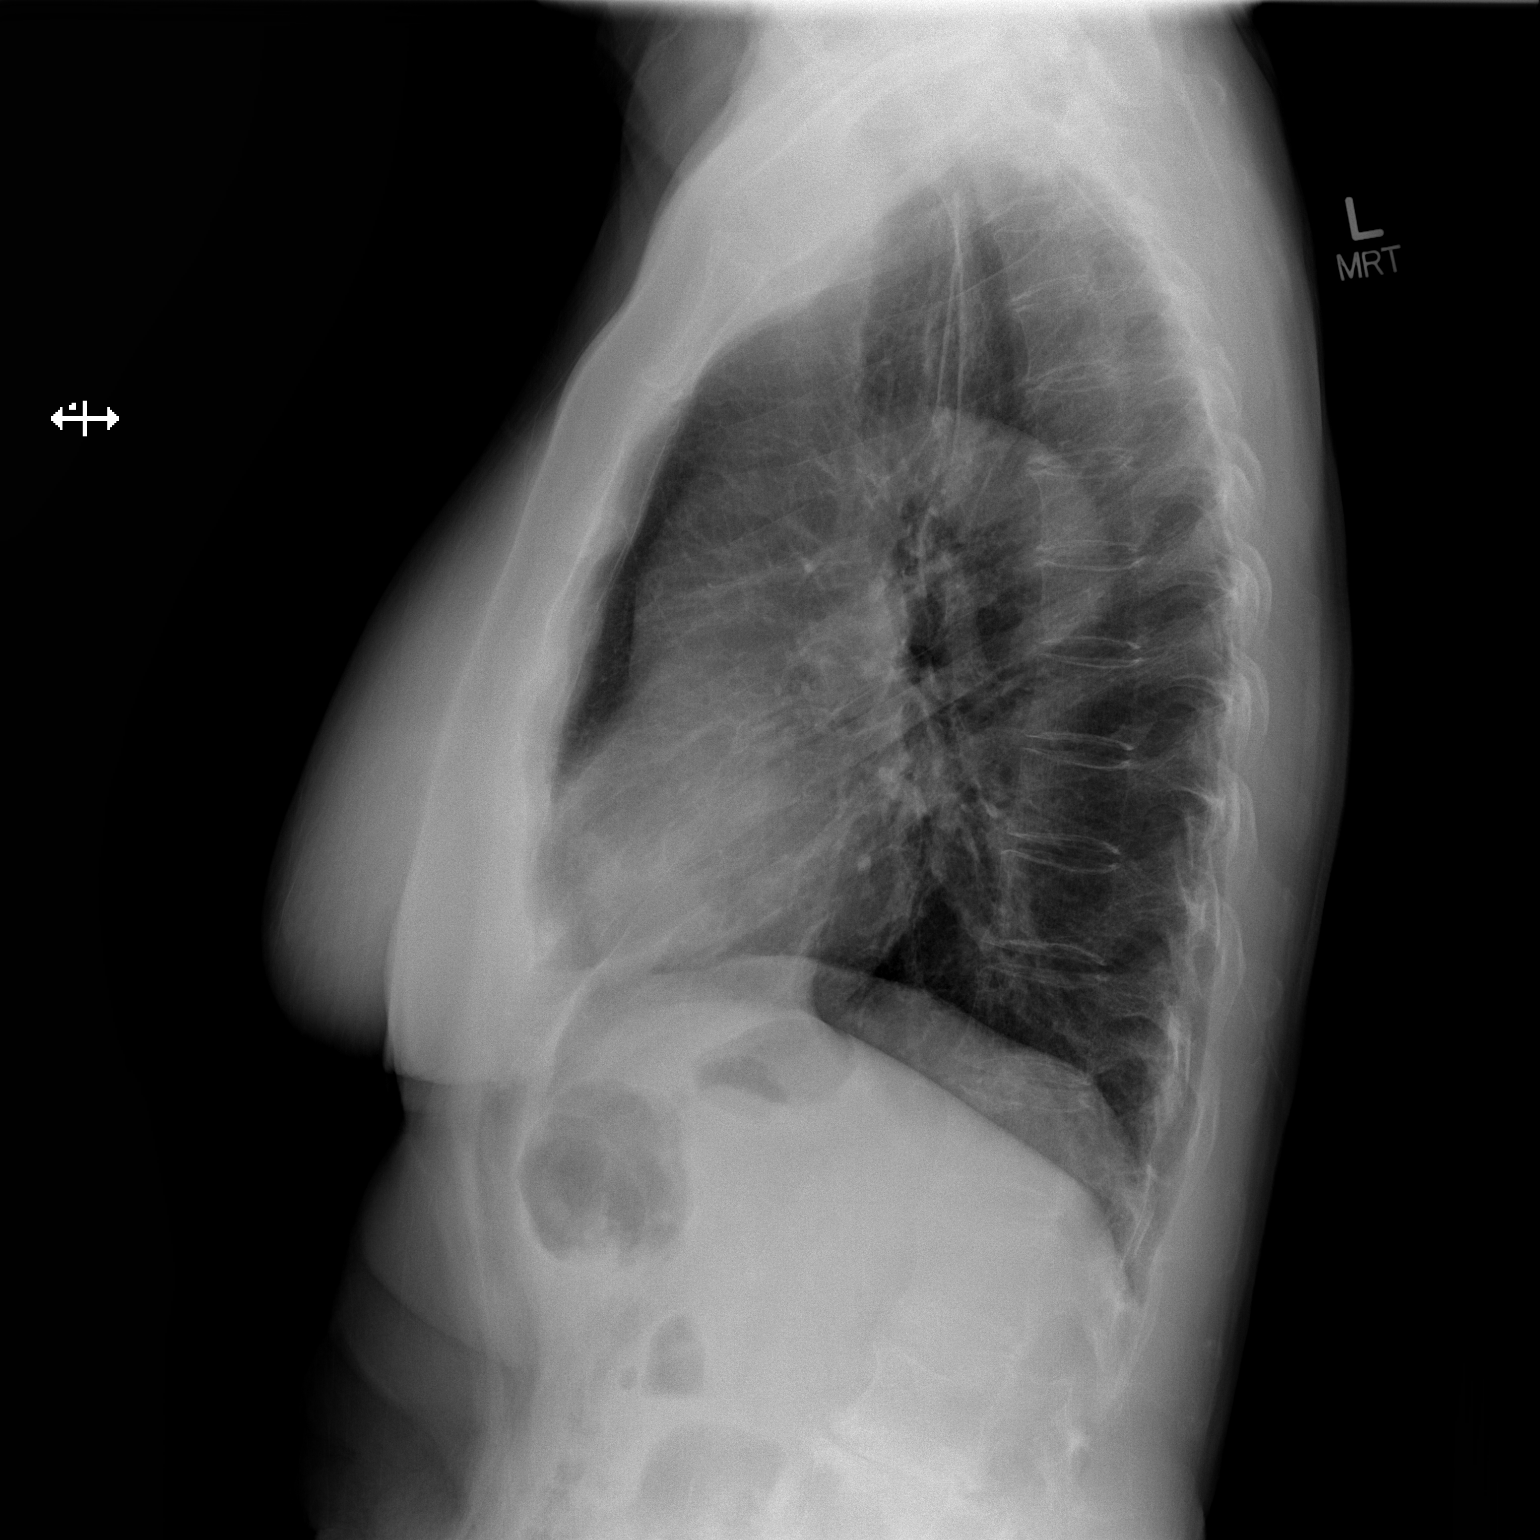

[2 of 2 positions shown; findings below may reference images not displayed]

FINDINGS: Heart size is normal. Overall cardiomediastinal silhouette is within
normal limits in size and configuration.

Probable mild scarring/fibrosis within the lower lobes bilaterally.
No confluent opacity to suggest a developing pneumonia. No pleural
effusion. Osseous structures about the chest are unremarkable.
IMPRESSION: No active cardiopulmonary disease.

## 2018-02-20 DIAGNOSIS — N3001 Acute cystitis with hematuria: Secondary | ICD-10-CM | POA: Diagnosis not present

## 2018-02-20 DIAGNOSIS — R300 Dysuria: Secondary | ICD-10-CM | POA: Diagnosis not present

## 2018-02-20 DIAGNOSIS — N3091 Cystitis, unspecified with hematuria: Secondary | ICD-10-CM | POA: Diagnosis not present

## 2018-02-22 DIAGNOSIS — N281 Cyst of kidney, acquired: Secondary | ICD-10-CM | POA: Diagnosis not present

## 2018-02-22 DIAGNOSIS — N368 Other specified disorders of urethra: Secondary | ICD-10-CM | POA: Diagnosis not present

## 2018-02-23 DIAGNOSIS — N281 Cyst of kidney, acquired: Secondary | ICD-10-CM | POA: Diagnosis not present

## 2018-03-06 DIAGNOSIS — Z86010 Personal history of colonic polyps: Secondary | ICD-10-CM | POA: Diagnosis not present

## 2018-03-06 DIAGNOSIS — Z6826 Body mass index (BMI) 26.0-26.9, adult: Secondary | ICD-10-CM | POA: Diagnosis not present

## 2018-03-06 DIAGNOSIS — M810 Age-related osteoporosis without current pathological fracture: Secondary | ICD-10-CM | POA: Diagnosis not present

## 2018-03-06 DIAGNOSIS — Z0000 Encounter for general adult medical examination without abnormal findings: Secondary | ICD-10-CM | POA: Diagnosis not present

## 2018-05-24 DIAGNOSIS — R300 Dysuria: Secondary | ICD-10-CM | POA: Diagnosis not present

## 2018-05-24 DIAGNOSIS — R1084 Generalized abdominal pain: Secondary | ICD-10-CM | POA: Diagnosis not present

## 2018-05-24 DIAGNOSIS — N3001 Acute cystitis with hematuria: Secondary | ICD-10-CM | POA: Diagnosis not present

## 2018-06-23 DIAGNOSIS — E041 Nontoxic single thyroid nodule: Secondary | ICD-10-CM | POA: Diagnosis not present

## 2018-07-10 DIAGNOSIS — E041 Nontoxic single thyroid nodule: Secondary | ICD-10-CM | POA: Diagnosis not present

## 2018-09-30 DIAGNOSIS — J069 Acute upper respiratory infection, unspecified: Secondary | ICD-10-CM | POA: Diagnosis not present

## 2018-10-05 DIAGNOSIS — J189 Pneumonia, unspecified organism: Secondary | ICD-10-CM | POA: Diagnosis not present

## 2019-03-03 DIAGNOSIS — N3090 Cystitis, unspecified without hematuria: Secondary | ICD-10-CM | POA: Diagnosis not present

## 2019-04-12 DIAGNOSIS — M7061 Trochanteric bursitis, right hip: Secondary | ICD-10-CM | POA: Diagnosis not present

## 2019-06-06 DIAGNOSIS — N3000 Acute cystitis without hematuria: Secondary | ICD-10-CM | POA: Diagnosis not present

## 2019-07-07 DIAGNOSIS — R300 Dysuria: Secondary | ICD-10-CM | POA: Diagnosis not present

## 2019-07-07 DIAGNOSIS — N3090 Cystitis, unspecified without hematuria: Secondary | ICD-10-CM | POA: Diagnosis not present

## 2019-07-12 DIAGNOSIS — M810 Age-related osteoporosis without current pathological fracture: Secondary | ICD-10-CM | POA: Diagnosis not present

## 2019-07-12 DIAGNOSIS — Z0000 Encounter for general adult medical examination without abnormal findings: Secondary | ICD-10-CM | POA: Diagnosis not present

## 2019-07-12 DIAGNOSIS — Z6825 Body mass index (BMI) 25.0-25.9, adult: Secondary | ICD-10-CM | POA: Diagnosis not present

## 2019-11-09 DIAGNOSIS — R202 Paresthesia of skin: Secondary | ICD-10-CM | POA: Diagnosis not present

## 2020-01-29 DIAGNOSIS — M79641 Pain in right hand: Secondary | ICD-10-CM | POA: Diagnosis not present

## 2020-03-05 DIAGNOSIS — G5601 Carpal tunnel syndrome, right upper limb: Secondary | ICD-10-CM | POA: Diagnosis not present

## 2020-03-06 DIAGNOSIS — G5601 Carpal tunnel syndrome, right upper limb: Secondary | ICD-10-CM | POA: Diagnosis not present

## 2020-06-21 DIAGNOSIS — Z23 Encounter for immunization: Secondary | ICD-10-CM | POA: Diagnosis not present

## 2020-07-14 DIAGNOSIS — Z0000 Encounter for general adult medical examination without abnormal findings: Secondary | ICD-10-CM | POA: Diagnosis not present

## 2020-07-14 DIAGNOSIS — Z6824 Body mass index (BMI) 24.0-24.9, adult: Secondary | ICD-10-CM | POA: Diagnosis not present

## 2020-08-15 DIAGNOSIS — N3000 Acute cystitis without hematuria: Secondary | ICD-10-CM | POA: Diagnosis not present

## 2021-06-08 DIAGNOSIS — Z23 Encounter for immunization: Secondary | ICD-10-CM | POA: Diagnosis not present

## 2021-07-07 DIAGNOSIS — M7061 Trochanteric bursitis, right hip: Secondary | ICD-10-CM | POA: Diagnosis not present

## 2021-07-14 DIAGNOSIS — Z0000 Encounter for general adult medical examination without abnormal findings: Secondary | ICD-10-CM | POA: Diagnosis not present

## 2021-07-14 DIAGNOSIS — E7800 Pure hypercholesterolemia, unspecified: Secondary | ICD-10-CM | POA: Diagnosis not present

## 2021-07-14 DIAGNOSIS — Z6824 Body mass index (BMI) 24.0-24.9, adult: Secondary | ICD-10-CM | POA: Diagnosis not present

## 2021-09-12 DIAGNOSIS — N3091 Cystitis, unspecified with hematuria: Secondary | ICD-10-CM | POA: Diagnosis not present

## 2021-09-12 DIAGNOSIS — R300 Dysuria: Secondary | ICD-10-CM | POA: Diagnosis not present

## 2021-09-22 DIAGNOSIS — R350 Frequency of micturition: Secondary | ICD-10-CM | POA: Diagnosis not present

## 2021-09-22 DIAGNOSIS — N3000 Acute cystitis without hematuria: Secondary | ICD-10-CM | POA: Diagnosis not present

## 2021-10-08 DIAGNOSIS — K5901 Slow transit constipation: Secondary | ICD-10-CM | POA: Diagnosis not present

## 2021-10-08 DIAGNOSIS — M1611 Unilateral primary osteoarthritis, right hip: Secondary | ICD-10-CM | POA: Diagnosis not present

## 2021-10-08 DIAGNOSIS — I700 Atherosclerosis of aorta: Secondary | ICD-10-CM | POA: Diagnosis not present

## 2021-10-08 DIAGNOSIS — K5900 Constipation, unspecified: Secondary | ICD-10-CM | POA: Diagnosis not present

## 2021-10-08 DIAGNOSIS — J449 Chronic obstructive pulmonary disease, unspecified: Secondary | ICD-10-CM | POA: Diagnosis not present

## 2021-11-05 DIAGNOSIS — N3000 Acute cystitis without hematuria: Secondary | ICD-10-CM | POA: Diagnosis not present

## 2021-11-05 DIAGNOSIS — R350 Frequency of micturition: Secondary | ICD-10-CM | POA: Diagnosis not present

## 2021-12-28 DIAGNOSIS — J22 Unspecified acute lower respiratory infection: Secondary | ICD-10-CM | POA: Diagnosis not present

## 2022-02-15 DIAGNOSIS — R21 Rash and other nonspecific skin eruption: Secondary | ICD-10-CM | POA: Diagnosis not present

## 2022-03-09 DIAGNOSIS — R21 Rash and other nonspecific skin eruption: Secondary | ICD-10-CM | POA: Diagnosis not present

## 2022-03-11 DIAGNOSIS — R21 Rash and other nonspecific skin eruption: Secondary | ICD-10-CM | POA: Diagnosis not present

## 2022-03-25 DIAGNOSIS — M7061 Trochanteric bursitis, right hip: Secondary | ICD-10-CM | POA: Diagnosis not present

## 2022-05-17 DIAGNOSIS — R21 Rash and other nonspecific skin eruption: Secondary | ICD-10-CM | POA: Diagnosis not present

## 2022-06-07 DIAGNOSIS — N3000 Acute cystitis without hematuria: Secondary | ICD-10-CM | POA: Diagnosis not present

## 2022-06-07 DIAGNOSIS — R300 Dysuria: Secondary | ICD-10-CM | POA: Diagnosis not present

## 2022-06-10 DIAGNOSIS — R21 Rash and other nonspecific skin eruption: Secondary | ICD-10-CM | POA: Diagnosis not present

## 2022-06-28 DIAGNOSIS — D485 Neoplasm of uncertain behavior of skin: Secondary | ICD-10-CM | POA: Diagnosis not present

## 2022-07-20 DIAGNOSIS — Z23 Encounter for immunization: Secondary | ICD-10-CM | POA: Diagnosis not present

## 2022-07-28 DIAGNOSIS — G3184 Mild cognitive impairment, so stated: Secondary | ICD-10-CM | POA: Diagnosis not present

## 2022-07-28 DIAGNOSIS — Z1389 Encounter for screening for other disorder: Secondary | ICD-10-CM | POA: Diagnosis not present

## 2022-07-28 DIAGNOSIS — Z0000 Encounter for general adult medical examination without abnormal findings: Secondary | ICD-10-CM | POA: Diagnosis not present

## 2022-08-31 DIAGNOSIS — S81812A Laceration without foreign body, left lower leg, initial encounter: Secondary | ICD-10-CM | POA: Diagnosis not present

## 2022-09-02 DIAGNOSIS — T1490XD Injury, unspecified, subsequent encounter: Secondary | ICD-10-CM | POA: Diagnosis not present

## 2022-09-02 DIAGNOSIS — S81812A Laceration without foreign body, left lower leg, initial encounter: Secondary | ICD-10-CM | POA: Diagnosis not present

## 2022-09-08 DIAGNOSIS — L03116 Cellulitis of left lower limb: Secondary | ICD-10-CM | POA: Diagnosis not present

## 2022-09-13 DIAGNOSIS — L03116 Cellulitis of left lower limb: Secondary | ICD-10-CM | POA: Diagnosis not present

## 2022-09-14 ENCOUNTER — Telehealth: Payer: Self-pay

## 2022-09-14 DIAGNOSIS — S91012A Laceration without foreign body, left ankle, initial encounter: Secondary | ICD-10-CM | POA: Diagnosis not present

## 2022-09-14 NOTE — Telephone Encounter (Signed)
        Patient  visited [facility] on 12/7     Telephone encounter attempt :  1st  A HIPAA compliant voice message was left requesting a return call.  Instructed patient to call back .    [place], Care Management  [phone] [address], [place], [place] [address] Phone: [phone] Email: [email or web address]

## 2022-09-15 ENCOUNTER — Telehealth: Payer: Self-pay

## 2022-09-15 NOTE — Telephone Encounter (Signed)
        Patient  visited [facility] on 12/7     Telephone encounter attempt :  2nd  A HIPAA compliant voice message was left requesting a return call.  Instructed patient to call back .    [place], Care Management  [phone] [address], [place], [place] [address] Phone: [phone] Email: [email or web address]

## 2022-09-17 DIAGNOSIS — U071 COVID-19: Secondary | ICD-10-CM | POA: Diagnosis not present

## 2022-09-17 DIAGNOSIS — R531 Weakness: Secondary | ICD-10-CM | POA: Diagnosis not present

## 2022-09-17 DIAGNOSIS — R9431 Abnormal electrocardiogram [ECG] [EKG]: Secondary | ICD-10-CM | POA: Diagnosis not present

## 2022-09-17 DIAGNOSIS — O071 Delayed or excessive hemorrhage following failed attempted termination of pregnancy: Secondary | ICD-10-CM | POA: Diagnosis not present

## 2022-09-30 DIAGNOSIS — J0100 Acute maxillary sinusitis, unspecified: Secondary | ICD-10-CM | POA: Diagnosis not present

## 2022-10-01 ENCOUNTER — Telehealth: Payer: Self-pay

## 2022-10-01 NOTE — Telephone Encounter (Signed)
        Patient  visited [facility] on 09/18/2022  for treatment.   Telephone encounter attempt :  1st  A HIPAA compliant voice message was left requesting a return call.  Instructed patient to call back at [phone].   [facility] Resource Care Guide   [email or web address]  ?? [phone]   Website: [email or web address]  [email or web address]

## 2022-10-05 ENCOUNTER — Telehealth: Payer: Self-pay

## 2022-10-05 NOTE — Telephone Encounter (Signed)
     Patient  visit on 09/18/2022  at [facility] was for treatment. Patient refused to engage in questions.  Have you been able to follow up with your primary care physician?   The patient was or was not able to obtain any needed medicine or equipment.  Are there diet recommendations that you are having difficulty following?  Patient expresses understanding of discharge instructions and education provided has no other needs at this time.    [place] Resource Care Guide   ??[email or web address]  ?? [phone]   Website: [email or web address]  [email or web address]

## 2022-10-25 DIAGNOSIS — R531 Weakness: Secondary | ICD-10-CM | POA: Diagnosis not present

## 2022-10-25 DIAGNOSIS — R55 Syncope and collapse: Secondary | ICD-10-CM | POA: Diagnosis not present

## 2022-10-25 DIAGNOSIS — T50901A Poisoning by unspecified drugs, medicaments and biological substances, accidental (unintentional), initial encounter: Secondary | ICD-10-CM | POA: Diagnosis not present

## 2022-10-25 DIAGNOSIS — R9431 Abnormal electrocardiogram [ECG] [EKG]: Secondary | ICD-10-CM | POA: Diagnosis not present

## 2022-10-28 DIAGNOSIS — T50901A Poisoning by unspecified drugs, medicaments and biological substances, accidental (unintentional), initial encounter: Secondary | ICD-10-CM | POA: Diagnosis not present

## 2022-11-25 DIAGNOSIS — M7061 Trochanteric bursitis, right hip: Secondary | ICD-10-CM | POA: Diagnosis not present

## 2022-12-07 DIAGNOSIS — F03A18 Unspecified dementia, mild, with other behavioral disturbance: Secondary | ICD-10-CM | POA: Diagnosis not present

## 2022-12-08 DIAGNOSIS — D519 Vitamin B12 deficiency anemia, unspecified: Secondary | ICD-10-CM | POA: Diagnosis not present

## 2022-12-16 DIAGNOSIS — R413 Other amnesia: Secondary | ICD-10-CM | POA: Diagnosis not present

## 2022-12-16 DIAGNOSIS — F03A18 Unspecified dementia, mild, with other behavioral disturbance: Secondary | ICD-10-CM | POA: Diagnosis not present

## 2022-12-16 DIAGNOSIS — I6789 Other cerebrovascular disease: Secondary | ICD-10-CM | POA: Diagnosis not present

## 2023-01-13 DIAGNOSIS — D519 Vitamin B12 deficiency anemia, unspecified: Secondary | ICD-10-CM | POA: Diagnosis not present

## 2023-01-13 DIAGNOSIS — F03A18 Unspecified dementia, mild, with other behavioral disturbance: Secondary | ICD-10-CM | POA: Diagnosis not present

## 2023-02-14 DIAGNOSIS — D519 Vitamin B12 deficiency anemia, unspecified: Secondary | ICD-10-CM | POA: Diagnosis not present

## 2023-02-22 DIAGNOSIS — S81812A Laceration without foreign body, left lower leg, initial encounter: Secondary | ICD-10-CM | POA: Diagnosis not present

## 2023-03-03 DIAGNOSIS — S93602A Unspecified sprain of left foot, initial encounter: Secondary | ICD-10-CM | POA: Diagnosis not present

## 2023-03-03 DIAGNOSIS — M79672 Pain in left foot: Secondary | ICD-10-CM | POA: Diagnosis not present

## 2023-03-17 DIAGNOSIS — D519 Vitamin B12 deficiency anemia, unspecified: Secondary | ICD-10-CM | POA: Diagnosis not present

## 2023-04-21 DIAGNOSIS — D519 Vitamin B12 deficiency anemia, unspecified: Secondary | ICD-10-CM | POA: Diagnosis not present

## 2023-04-21 DIAGNOSIS — Z6823 Body mass index (BMI) 23.0-23.9, adult: Secondary | ICD-10-CM | POA: Diagnosis not present

## 2023-04-21 DIAGNOSIS — F03A18 Unspecified dementia, mild, with other behavioral disturbance: Secondary | ICD-10-CM | POA: Diagnosis not present

## 2023-04-22 DIAGNOSIS — D519 Vitamin B12 deficiency anemia, unspecified: Secondary | ICD-10-CM | POA: Diagnosis not present

## 2023-05-27 DIAGNOSIS — N390 Urinary tract infection, site not specified: Secondary | ICD-10-CM | POA: Diagnosis not present

## 2023-06-03 DIAGNOSIS — N3000 Acute cystitis without hematuria: Secondary | ICD-10-CM | POA: Diagnosis not present

## 2023-06-12 DIAGNOSIS — R300 Dysuria: Secondary | ICD-10-CM | POA: Diagnosis not present

## 2023-06-12 DIAGNOSIS — N3091 Cystitis, unspecified with hematuria: Secondary | ICD-10-CM | POA: Diagnosis not present

## 2023-06-15 DIAGNOSIS — R309 Painful micturition, unspecified: Secondary | ICD-10-CM | POA: Diagnosis not present

## 2023-06-29 DIAGNOSIS — B3731 Acute candidiasis of vulva and vagina: Secondary | ICD-10-CM | POA: Diagnosis not present

## 2023-06-29 DIAGNOSIS — R300 Dysuria: Secondary | ICD-10-CM | POA: Diagnosis not present

## 2023-07-03 DIAGNOSIS — R350 Frequency of micturition: Secondary | ICD-10-CM | POA: Diagnosis not present

## 2023-07-03 DIAGNOSIS — N3000 Acute cystitis without hematuria: Secondary | ICD-10-CM | POA: Diagnosis not present

## 2023-07-03 DIAGNOSIS — N94819 Vulvodynia, unspecified: Secondary | ICD-10-CM | POA: Diagnosis not present

## 2023-07-03 DIAGNOSIS — R300 Dysuria: Secondary | ICD-10-CM | POA: Diagnosis not present

## 2023-07-20 DIAGNOSIS — R300 Dysuria: Secondary | ICD-10-CM | POA: Diagnosis not present

## 2023-07-20 DIAGNOSIS — N3091 Cystitis, unspecified with hematuria: Secondary | ICD-10-CM | POA: Diagnosis not present

## 2023-07-20 DIAGNOSIS — R102 Pelvic and perineal pain: Secondary | ICD-10-CM | POA: Diagnosis not present

## 2023-07-20 DIAGNOSIS — N3001 Acute cystitis with hematuria: Secondary | ICD-10-CM | POA: Diagnosis not present

## 2023-07-23 DIAGNOSIS — R300 Dysuria: Secondary | ICD-10-CM | POA: Diagnosis not present

## 2023-08-01 DIAGNOSIS — H5330 Unspecified disorder of binocular vision: Secondary | ICD-10-CM | POA: Diagnosis not present

## 2023-08-01 DIAGNOSIS — D519 Vitamin B12 deficiency anemia, unspecified: Secondary | ICD-10-CM | POA: Diagnosis not present

## 2023-08-01 DIAGNOSIS — G3184 Mild cognitive impairment, so stated: Secondary | ICD-10-CM | POA: Diagnosis not present

## 2023-08-01 DIAGNOSIS — Z0000 Encounter for general adult medical examination without abnormal findings: Secondary | ICD-10-CM | POA: Diagnosis not present

## 2023-08-01 DIAGNOSIS — M810 Age-related osteoporosis without current pathological fracture: Secondary | ICD-10-CM | POA: Diagnosis not present

## 2023-08-01 DIAGNOSIS — Z1389 Encounter for screening for other disorder: Secondary | ICD-10-CM | POA: Diagnosis not present

## 2023-08-01 DIAGNOSIS — Z96652 Presence of left artificial knee joint: Secondary | ICD-10-CM | POA: Diagnosis not present

## 2023-08-01 DIAGNOSIS — E559 Vitamin D deficiency, unspecified: Secondary | ICD-10-CM | POA: Diagnosis not present

## 2023-08-01 DIAGNOSIS — Z6823 Body mass index (BMI) 23.0-23.9, adult: Secondary | ICD-10-CM | POA: Diagnosis not present

## 2023-10-01 DIAGNOSIS — R300 Dysuria: Secondary | ICD-10-CM | POA: Diagnosis not present

## 2023-10-01 DIAGNOSIS — N760 Acute vaginitis: Secondary | ICD-10-CM | POA: Diagnosis not present

## 2023-10-01 DIAGNOSIS — N3001 Acute cystitis with hematuria: Secondary | ICD-10-CM | POA: Diagnosis not present

## 2023-10-04 DIAGNOSIS — R300 Dysuria: Secondary | ICD-10-CM | POA: Diagnosis not present

## 2023-10-04 DIAGNOSIS — N3000 Acute cystitis without hematuria: Secondary | ICD-10-CM | POA: Diagnosis not present

## 2023-10-26 DIAGNOSIS — M8589 Other specified disorders of bone density and structure, multiple sites: Secondary | ICD-10-CM | POA: Diagnosis not present

## 2023-10-27 DIAGNOSIS — M810 Age-related osteoporosis without current pathological fracture: Secondary | ICD-10-CM | POA: Diagnosis not present

## 2023-10-27 DIAGNOSIS — Z13820 Encounter for screening for osteoporosis: Secondary | ICD-10-CM | POA: Diagnosis not present

## 2023-11-05 DIAGNOSIS — N3000 Acute cystitis without hematuria: Secondary | ICD-10-CM | POA: Diagnosis not present

## 2023-11-05 DIAGNOSIS — N3090 Cystitis, unspecified without hematuria: Secondary | ICD-10-CM | POA: Diagnosis not present

## 2023-11-20 DIAGNOSIS — K529 Noninfective gastroenteritis and colitis, unspecified: Secondary | ICD-10-CM | POA: Diagnosis not present

## 2023-11-20 DIAGNOSIS — Z79899 Other long term (current) drug therapy: Secondary | ICD-10-CM | POA: Diagnosis not present

## 2023-11-22 DIAGNOSIS — A084 Viral intestinal infection, unspecified: Secondary | ICD-10-CM | POA: Diagnosis not present

## 2024-01-31 DIAGNOSIS — M25559 Pain in unspecified hip: Secondary | ICD-10-CM | POA: Diagnosis not present

## 2024-01-31 DIAGNOSIS — Z79899 Other long term (current) drug therapy: Secondary | ICD-10-CM | POA: Diagnosis not present

## 2024-01-31 DIAGNOSIS — M810 Age-related osteoporosis without current pathological fracture: Secondary | ICD-10-CM | POA: Diagnosis not present

## 2024-02-15 DIAGNOSIS — M810 Age-related osteoporosis without current pathological fracture: Secondary | ICD-10-CM | POA: Diagnosis not present

## 2024-03-01 DIAGNOSIS — T887XXA Unspecified adverse effect of drug or medicament, initial encounter: Secondary | ICD-10-CM | POA: Diagnosis not present

## 2024-03-01 DIAGNOSIS — M25551 Pain in right hip: Secondary | ICD-10-CM | POA: Diagnosis not present

## 2024-03-01 DIAGNOSIS — M1611 Unilateral primary osteoarthritis, right hip: Secondary | ICD-10-CM | POA: Diagnosis not present

## 2024-03-15 DIAGNOSIS — G8929 Other chronic pain: Secondary | ICD-10-CM | POA: Diagnosis not present

## 2024-03-15 DIAGNOSIS — M25551 Pain in right hip: Secondary | ICD-10-CM | POA: Diagnosis not present

## 2024-05-13 ENCOUNTER — Emergency Department (HOSPITAL_COMMUNITY)

## 2024-05-13 ENCOUNTER — Other Ambulatory Visit: Payer: Self-pay

## 2024-05-13 ENCOUNTER — Encounter (HOSPITAL_COMMUNITY): Payer: Self-pay

## 2024-05-13 ENCOUNTER — Observation Stay (HOSPITAL_COMMUNITY)
Admission: EM | Admit: 2024-05-13 | Discharge: 2024-05-15 | Disposition: A | Discharge status: Home / Self Care | Attending: Internal Medicine | Admitting: Internal Medicine

## 2024-05-13 DIAGNOSIS — R251 Tremor, unspecified: Secondary | ICD-10-CM | POA: Diagnosis not present

## 2024-05-13 DIAGNOSIS — N390 Urinary tract infection, site not specified: Secondary | ICD-10-CM | POA: Insufficient documentation

## 2024-05-13 DIAGNOSIS — R42 Dizziness and giddiness: Secondary | ICD-10-CM | POA: Diagnosis not present

## 2024-05-13 DIAGNOSIS — R55 Syncope and collapse: Secondary | ICD-10-CM | POA: Diagnosis not present

## 2024-05-13 DIAGNOSIS — W19XXXA Unspecified fall, initial encounter: Secondary | ICD-10-CM | POA: Diagnosis not present

## 2024-05-13 DIAGNOSIS — R001 Bradycardia, unspecified: Secondary | ICD-10-CM | POA: Diagnosis not present

## 2024-05-13 DIAGNOSIS — R4182 Altered mental status, unspecified: Secondary | ICD-10-CM | POA: Diagnosis not present

## 2024-05-13 DIAGNOSIS — H547 Unspecified visual loss: Secondary | ICD-10-CM | POA: Diagnosis present

## 2024-05-13 LAB — URINALYSIS, ROUTINE W REFLEX MICROSCOPIC
Bilirubin Urine: NEGATIVE
Glucose, UA: NEGATIVE mg/dL
Ketones, ur: NEGATIVE mg/dL
Nitrite: NEGATIVE
Protein, ur: NEGATIVE mg/dL
Specific Gravity, Urine: 1.005 (ref 1.005–1.030)
WBC, UA: >50 WBC/hpf (ref 0–5)
pH: 6 (ref 5.0–8.0)

## 2024-05-13 LAB — CBC
HCT: 40.1 % (ref 36.0–46.0)
Hemoglobin: 13.1 g/dL (ref 12.0–15.0)
MCH: 27.9 pg (ref 26.0–34.0)
MCHC: 32.7 g/dL (ref 30.0–36.0)
MCV: 85.3 fL (ref 80.0–100.0)
Platelets: 349 K/uL (ref 150–400)
RBC: 4.7 MIL/uL (ref 3.87–5.11)
RDW: 15.4 % (ref 11.5–15.5)
WBC: 8.1 K/uL (ref 4.0–10.5)
nRBC: 0 % (ref 0.0–0.2)

## 2024-05-13 LAB — COMPREHENSIVE METABOLIC PANEL WITH GFR
ALT: 11 U/L (ref 0–44)
AST: 20 U/L (ref 15–41)
Albumin: 3.1 g/dL — ABNORMAL LOW (ref 3.5–5.0)
Alkaline Phosphatase: 50 U/L (ref 38–126)
Anion gap: 8 (ref 5–15)
BUN: 8 mg/dL (ref 8–23)
CO2: 24 mmol/L (ref 22–32)
Calcium: 9.2 mg/dL (ref 8.9–10.3)
Chloride: 104 mmol/L (ref 98–111)
Creatinine, Ser: 0.95 mg/dL (ref 0.44–1.00)
GFR, Estimated: 59 mL/min — ABNORMAL LOW (ref >60)
Glucose, Bld: 97 mg/dL (ref 70–99)
Potassium: 4.3 mmol/L (ref 3.5–5.1)
Sodium: 136 mmol/L (ref 135–145)
Total Bilirubin: 0.8 mg/dL (ref 0.0–1.2)
Total Protein: 5.9 g/dL — ABNORMAL LOW (ref 6.5–8.1)

## 2024-05-13 LAB — TROPONIN I (HIGH SENSITIVITY)
Troponin I (High Sensitivity): 8 ng/L (ref <18)
Troponin I (High Sensitivity): 12 ng/L (ref <18)

## 2024-05-13 LAB — CBG MONITORING, ED: Glucose-Capillary: 93 mg/dL (ref 70–99)

## 2024-05-13 MED ORDER — ENOXAPARIN SODIUM 40 MG/0.4ML IJ SOSY
40.0000 mg | PREFILLED_SYRINGE | INTRAMUSCULAR | Status: DC
  Administered 2024-05-14 – 2024-05-15 (×2): 40 mg via SUBCUTANEOUS
  Filled 2024-05-13 (×2): qty 0.4

## 2024-05-13 MED ORDER — SODIUM CHLORIDE 0.9 % IV BOLUS
500.0000 mL | Freq: Once | INTRAVENOUS | Status: AC
  Administered 2024-05-13: 500 mL via INTRAVENOUS

## 2024-05-13 MED ORDER — SODIUM CHLORIDE 0.9% FLUSH
3.0000 mL | Freq: Two times a day (BID) | INTRAVENOUS | Status: DC
  Administered 2024-05-13 – 2024-05-15 (×4): 3 mL via INTRAVENOUS

## 2024-05-13 MED ORDER — SODIUM CHLORIDE 0.9 % IV SOLN
1.0000 g | INTRAVENOUS | Status: DC
  Administered 2024-05-14 – 2024-05-15 (×2): 1 g via INTRAVENOUS
  Filled 2024-05-13 (×2): qty 10

## 2024-05-13 MED ORDER — ACETAMINOPHEN 325 MG PO TABS: 650.0000 mg | ORAL_TABLET | Freq: Four times a day (QID) | ORAL | Status: DC | PRN

## 2024-05-13 MED ORDER — ACETAMINOPHEN 650 MG RE SUPP: 650.0000 mg | Freq: Four times a day (QID) | RECTAL | Status: DC | PRN

## 2024-05-13 MED ORDER — CEFTRIAXONE SODIUM 1 G IJ SOLR
1.0000 g | Freq: Once | INTRAMUSCULAR | Status: AC
  Administered 2024-05-13: 1 g via INTRAVENOUS
  Filled 2024-05-13: qty 10

## 2024-05-13 MED ORDER — POLYETHYLENE GLYCOL 3350 17 G PO PACK: 17.0000 g | PACK | Freq: Every day | ORAL | Status: DC | PRN

## 2024-05-13 NOTE — ED Triage Notes (Signed)
 Pt bib ems from church, started to feel dizziness and had a near syncopal episode. Pt having tremors since then. Stroke screen negative with ems. VSS

## 2024-05-13 NOTE — ED Notes (Signed)
 Lab called, and troponin added to previously sent blood

## 2024-05-13 NOTE — ED Notes (Signed)
 Patient very agitated she she is hungry, wanting something to eat. Patient yelling and cursing, threatening to leave after ripping out IV. I advised patient she did not need to, as she initially came in due to dizziness and near syncope, and that she could fall and hit head, causing more issues than she currently has. Patient given food, diet ordered.

## 2024-05-13 NOTE — ED Provider Notes (Signed)
 [place] EMERGENCY DEPARTMENT AT [facility] Provider Note   CSN: [id number] Arrival date & time: 05/13/24  1015     Patient presents with: Dizziness and Near Syncope   Brenda Lang is a 88 y.o. female.   HPI 88 yo female ho bradycardia was at church this am when she felt lightheaded and had to sit down.  No loc, no chest pain or sob, no lateralized weakness.  She reports that all 4 extremities with some jerking.  No loc. EMS called and transferred. Daughter states the patient complained of some decrease in vision bilaterally prior to the near syncopal episode.  She was awake during the entire episode.  Family reports that her blood pressure is normally low and it was hypertensive prehospital 160s.    Prior to Admission medications   Medication Sig Start Date End Date Taking? Authorizing Provider  Apoaequorin (PREVAGEN PO) Take 1 capsule by mouth daily.   Yes [provider]    Allergies: Bactrim    Review of Systems  Updated Vital Signs BP (!) 150/86   Pulse (!) 54   Temp 97.8 F (36.6 C)   Resp (!) 24   Ht 1.651 m (5' 5)   Wt 59 kg   SpO2 100%   BMI 21.63 kg/m   Physical Exam Vitals and nursing note reviewed.  Constitutional:      Appearance: Normal appearance. She is obese.  HENT:     Head: Normocephalic and atraumatic.     Right Ear: External ear normal.     Left Ear: External ear normal.     Nose: Nose normal.     Mouth/Throat:     Mouth: Mucous membranes are moist.     Pharynx: Oropharynx is clear.  Eyes:     Extraocular Movements: Extraocular movements intact.     Pupils: Pupils are equal, round, and reactive to light.  Cardiovascular:     Rate and Rhythm: Normal rate and regular rhythm.     Pulses: Normal pulses.  Pulmonary:     Effort: Pulmonary effort is normal.     Breath sounds: Normal breath sounds.  Abdominal:     General: Abdomen is flat. Bowel sounds are normal.     Palpations: Abdomen is soft.     Tenderness:  There is no right CVA tenderness or left CVA tenderness.  Musculoskeletal:        General: Normal range of motion.     Cervical back: Normal range of motion.  Skin:    General: Skin is warm and dry.     Capillary Refill: Capillary refill takes less than 2 seconds.  Neurological:     General: No focal deficit present.     Mental Status: She is alert. Mental status is at baseline.     Cranial Nerves: No cranial nerve deficit.     Sensory: No sensory deficit.     Motor: No weakness.     Coordination: Coordination normal.  Psychiatric:        Mood and Affect: Mood normal.     (all labs ordered are listed, but only abnormal results are displayed) Labs Reviewed  COMPREHENSIVE METABOLIC PANEL WITH GFR - Abnormal; Notable for the following components:      Result Value   Total Protein 5.9 (*)    Albumin 3.1 (*)    GFR, Estimated 59 (*)    All other components within normal limits  URINALYSIS, ROUTINE W REFLEX MICROSCOPIC - Abnormal; Notable for the  following components:   APPearance HAZY (*)    Hgb urine dipstick SMALL (*)    Leukocytes,Ua LARGE (*)    Bacteria, UA MANY (*)    All other components within normal limits  URINE CULTURE  CBC  CBG MONITORING, ED  TROPONIN I (HIGH SENSITIVITY)  TROPONIN I (HIGH SENSITIVITY)    EKG: EKG Interpretation Date/Time:  Sunday May 13 2024 10:24:21 EDT Ventricular Rate:  55 PR Interval:  184 QRS Duration:  95 QT Interval:  423 QTC Calculation: 405 R Axis:   -18  Text Interpretation: Sinus rhythm with sinus pause Borderline left axis deviation Low voltage, precordial leads Confirmed by Levander Houston [phone]) on 05/13/2024 2:58:07 PM  Radiology: CT Head Wo Contrast Result Date: 05/13/2024 EXAM: CT HEAD WITHOUT CONTRAST 05/13/2024 11:59:02 AM TECHNIQUE: CT of the head was performed without the administration of intravenous contrast. Automated exposure control, iterative reconstruction, and/or weight based adjustment of the mA/kV was  utilized to reduce the radiation dose to as low as reasonably achievable. COMPARISON: 12/16/2022 CLINICAL HISTORY: Mental status change, unknown cause. Pt bib ems from church, started to feel dizziness and had a near syncopal episode. Pt having tremors since then. Stroke screen negative with ems. FINDINGS: BRAIN AND VENTRICLES: No acute hemorrhage. Gray-white differentiation is preserved. No hydrocephalus. No extra-axial collection. No mass effect or midline shift. Prominence of the sulci and ventricles compatible with brain atrophy. Hypoattenuating foci in the cerebral white matter, most likely representing chronic small vessel disease. ORBITS: No acute abnormality. SINUSES: No acute abnormality. SOFT TISSUES AND SKULL: No acute soft tissue abnormality. No skull fracture. IMPRESSION: 1. No acute intracranial abnormality. 2. Prominence of the sulci and ventricles compatible with brain atrophy. 3. Hypoattenuating foci in the cerebral white matter, most likely representing chronic small vessel disease. Electronically signed by: Waddell Calk MD 05/13/2024 12:20 PM EDT RP Workstation: [id number]     Procedures   Medications Ordered in the ED  cefTRIAXone  (ROCEPHIN ) 1 g in sodium chloride  0.9 % 100 mL IVPB (0 g Intravenous Stopped 05/13/24 1309)    Clinical Course as of 05/13/24 1606  Sun May 13, 2024  1230 CT head reviewed interpreted no evidence of acute abnormalities noted [DR]    Clinical Course User Index [DR] Levander Houston, MD                                 Medical Decision Making Amount and/or Complexity of Data Reviewed Labs: ordered. Radiology: ordered.  88 year old female previously healthy presents today with near syncopal episode with some clonic activity throughout all 4 extremities 1 syncope patient with known history of bradycardia.  However here she has gone down into the mid 40s on the monitor.  No acutely ischemic changes.  No chest pain.  Troponins are normal.  Per reports  patient has been hypertensive from baseline. 2 clonic jerks observed here in the ED.  Rhythmic all 4 extremities.  Patient remains awake and alert.  Head CT is negative.  Does not is not appear to be consistent with generalized seizure. 3 patient with greater than 50 white blood cells 610 red blood cells and many bacteria in urine.  She is having a urine culture ordered.  She received Rocephin  here in the ED. Based on patient's age and risk plan admission for observation on monitor Care discussed with Dr. Seena who will see for admission    Final diagnoses:  Near syncope  Urinary  tract infection with hematuria, site unspecified    ED Discharge Orders     None          Levander Houston, MD 05/13/24 1606

## 2024-05-13 NOTE — H&P (Signed)
 History and Physical   Brenda Lang FMW:[id number] DOB: 1936/09/03 DOA: 05/13/2024  PCP: Gayl Males, MD   Patient coming from: Home/church  Chief Complaint: Near syncope  HPI: Brenda Lang is a 88 y.o. female with medical history significant of bradycardia presenting with near syncope.  Patient was at church today when she stood she had episode of near syncope where she became lightheaded and had to sit down.  Had some arm and leg shaking but was alert the whole time with no loss of consciousness.  Also reportedly had some preceding peripheral vision loss which has improved.  Family reports some concern about possible early memory deficits but no diagnosis.  Patient denies fevers, chills, chest pain, shortness breath, abdominal pain, constipation, diarrhea, nausea, vomiting.  ED Course: Vital signs in the ED notable for blood pressure in the 150s-170 systolic, heart rate in the 40s-50s, respirate in the tens-20s.  Lab workup included CMP with protein 5.9, albumin 1.  CBC within normal notes.  Troponin negative x 2.  Urinalysis with large leukocytes and many bacteria.  Urine culture pending.  CT head showed no acute normality.  Patient seen ceftriaxone  in the ED  Review of Systems: As per HPI otherwise all other systems reviewed and are negative.  Past Medical History:  Diagnosis Date   Arthritis    Heart murmur    Knee pain     Past Surgical History:  Procedure Laterality Date   ABDOMINAL HYSTERECTOMY     EYE SURGERY     TOTAL KNEE ARTHROPLASTY Left 03/22/2016   Procedure: LEFT TOTAL KNEE ARTHROPLASTY;  Surgeon: Marcey Raman, MD;  Location: [facility] OR;  Service: Orthopedics;  Laterality: Left;    Social History  reports that she has never smoked. She has never used smokeless tobacco. She reports that she does not drink alcohol  and does not use drugs.  Allergies  Allergen Reactions   Bactrim Rash    History reviewed. No pertinent family history.   Prior to  Admission medications   Medication Sig Start Date End Date Taking? Authorizing Provider  Apoaequorin (PREVAGEN PO) Take 1 capsule by mouth daily.   Yes [provider]    Physical Exam: Vitals:   05/13/24 1330 05/13/24 1400 05/13/24 1430 05/13/24 1500  BP: (!) 154/82 (!) 153/98 (!) 159/96 (!) 150/86  Pulse: (!) 49 (!) 57 (!) 49 (!) 54  Resp: 20 13 16  (!) 24  Temp:    97.8 F (36.6 C)  TempSrc:      SpO2: 98% 100% 99% 100%  Weight:      Height:        Physical Exam Constitutional:      General: She is not in acute distress.    Appearance: Normal appearance.  HENT:     Head: Normocephalic and atraumatic.     Mouth/Throat:     Mouth: Mucous membranes are moist.     Pharynx: Oropharynx is clear.  Eyes:     Extraocular Movements: Extraocular movements intact.     Pupils: Pupils are equal, round, and reactive to light.  Cardiovascular:     Rate and Rhythm: Regular rhythm. Bradycardia present.     Pulses: Normal pulses.     Heart sounds: Normal heart sounds.  Pulmonary:     Effort: Pulmonary effort is normal. No respiratory distress.     Breath sounds: Normal breath sounds.  Abdominal:     General: Bowel sounds are normal. There is no distension.     Palpations:  Abdomen is soft.     Tenderness: There is no abdominal tenderness.  Musculoskeletal:        General: No swelling or deformity.  Skin:    General: Skin is warm and dry.  Neurological:     General: No focal deficit present.     Mental Status: Mental status is at baseline.    Labs on Admission: I have personally reviewed following labs and imaging studies  CBC: Recent Labs  Lab 05/13/24 1038  WBC 8.1  HGB 13.1  HCT 40.1  MCV 85.3  PLT 349    Basic Metabolic Panel: Recent Labs  Lab 05/13/24 1038  NA 136  K 4.3  CL 104  CO2 24  GLUCOSE 97  BUN 8  CREATININE 0.95  CALCIUM 9.2    GFR: Estimated Creatinine Clearance: 39 mL/min (by C-G formula based on SCr of 0.95 mg/dL).  Liver  Function Tests: Recent Labs  Lab 05/13/24 1038  AST 20  ALT 11  ALKPHOS 50  BILITOT 0.8  PROT 5.9*  ALBUMIN 3.1*    Urine analysis:    Component Value Date/Time   COLORURINE YELLOW 05/13/2024 1126   APPEARANCEUR HAZY (A) 05/13/2024 1126   LABSPEC 1.005 05/13/2024 1126   PHURINE 6.0 05/13/2024 1126   GLUCOSEU NEGATIVE 05/13/2024 1126   HGBUR SMALL (A) 05/13/2024 1126   BILIRUBINUR NEGATIVE 05/13/2024 1126   KETONESUR NEGATIVE 05/13/2024 1126   PROTEINUR NEGATIVE 05/13/2024 1126   NITRITE NEGATIVE 05/13/2024 1126   LEUKOCYTESUR LARGE (A) 05/13/2024 1126    Radiological Exams on Admission: CT Head Wo Contrast Result Date: 05/13/2024 EXAM: CT HEAD WITHOUT CONTRAST 05/13/2024 11:59:02 AM TECHNIQUE: CT of the head was performed without the administration of intravenous contrast. Automated exposure control, iterative reconstruction, and/or weight based adjustment of the mA/kV was utilized to reduce the radiation dose to as low as reasonably achievable. COMPARISON: 12/16/2022 CLINICAL HISTORY: Mental status change, unknown cause. Pt bib ems from church, started to feel dizziness and had a near syncopal episode. Pt having tremors since then. Stroke screen negative with ems. FINDINGS: BRAIN AND VENTRICLES: No acute hemorrhage. Gray-white differentiation is preserved. No hydrocephalus. No extra-axial collection. No mass effect or midline shift. Prominence of the sulci and ventricles compatible with brain atrophy. Hypoattenuating foci in the cerebral white matter, most likely representing chronic small vessel disease. ORBITS: No acute abnormality. SINUSES: No acute abnormality. SOFT TISSUES AND SKULL: No acute soft tissue abnormality. No skull fracture. IMPRESSION: 1. No acute intracranial abnormality. 2. Prominence of the sulci and ventricles compatible with brain atrophy. 3. Hypoattenuating foci in the cerebral white matter, most likely representing chronic small vessel disease. Electronically  signed by: Waddell Calk MD 05/13/2024 12:20 PM EDT RP Workstation: [id number]   EKG: Independently reviewed.  Sinus bradycardia cardia at 55 bpm.  Nonspecific T wave changes.  Low voltage multiple leads.  Assessment/Plan Principal Problem:   Near syncope Active Problems:   Bradycardia   UTI (urinary tract infection)   Near syncope Bradycardia > Patient with history of bradycardia now having near syncopal episode at church. > Had preceding symptoms of peripheral vision loss followed by lightheadedness.  Patient had to sit down and had some shaking of her arms and legs but never lost consciousness. > Not sudden onset loss of consciousness so less suspicious for longer sinus palsy, however could be some component of symptomatic bradycardia versus other. - Monitor on telemetry overnight - Echocardiogram - IV fluids - Check orthostatic vital signs - Supportive care  UTI >  Incidentally noted to have large leukocytes with many bacteria and also had some urinary frequency in the ED. - Started on ceftriaxone  in the ED, will continue - Follow-up urine cultures   DVT prophylaxis: Lovenox  Code Status:   Full Family Communication:  Updated at bedside  Disposition Plan:   Patient is from:  Home  Anticipated DC to:  Home  Anticipated DC date:  1 to 2 days  Anticipated DC barriers: None  Consults called:  None Admission status:  Observation, telemetry  Severity of Illness: The appropriate patient status for this patient is OBSERVATION. Observation status is judged to be reasonable and necessary in order to provide the required intensity of service to ensure the patient's safety. The patient's presenting symptoms, physical exam findings, and initial radiographic and laboratory data in the context of their medical condition is felt to place them at decreased risk for further clinical deterioration. Furthermore, it is anticipated that the patient will be medically stable for discharge from the  hospital within 2 midnights of admission.    Brenda KATHEE Scurry MD [facility]  How to contact the [facility] Attending or Consulting provider 7A - 7P or covering provider during after hours 7P -7A, for this patient?   Check the care team in [facility] and look for a) attending/consulting [facility] provider listed and b) the [facility] team listed Log into [email or web address] and use [place]'s universal password to access. If you do not have the password, please contact the hospital operator. Locate the [facility] provider you are looking for under [facility] and page to a number that you can be directly reached. If you still have difficulty reaching the provider, please page the [facility] (Director on Call) for the Hospitalists listed on amion for assistance.  05/13/2024, 4:50 PM

## 2024-05-14 ENCOUNTER — Observation Stay (HOSPITAL_BASED_OUTPATIENT_CLINIC_OR_DEPARTMENT_OTHER)

## 2024-05-14 DIAGNOSIS — R55 Syncope and collapse: Secondary | ICD-10-CM | POA: Diagnosis not present

## 2024-05-14 LAB — COMPREHENSIVE METABOLIC PANEL WITH GFR
ALT: 9 U/L (ref 0–44)
AST: 14 U/L — ABNORMAL LOW (ref 15–41)
Albumin: 2.8 g/dL — ABNORMAL LOW (ref 3.5–5.0)
Alkaline Phosphatase: 45 U/L (ref 38–126)
Anion gap: 5 (ref 5–15)
BUN: 8 mg/dL (ref 8–23)
CO2: 25 mmol/L (ref 22–32)
Calcium: 8.3 mg/dL — ABNORMAL LOW (ref 8.9–10.3)
Chloride: 110 mmol/L (ref 98–111)
Creatinine, Ser: 0.95 mg/dL (ref 0.44–1.00)
GFR, Estimated: 59 mL/min — ABNORMAL LOW (ref >60)
Glucose, Bld: 94 mg/dL (ref 70–99)
Potassium: 4.1 mmol/L (ref 3.5–5.1)
Sodium: 140 mmol/L (ref 135–145)
Total Bilirubin: 0.6 mg/dL (ref 0.0–1.2)
Total Protein: 5.4 g/dL — ABNORMAL LOW (ref 6.5–8.1)

## 2024-05-14 LAB — ECHOCARDIOGRAM COMPLETE
AR max vel: 3.07 cm2
AV Area VTI: 3.2 cm2
AV Area mean vel: 2.87 cm2
AV Mean grad: 4 mmHg
AV Peak grad: 6.9 mmHg
Ao pk vel: 1.31 m/s
Area-P 1/2: 3.72 cm2
Height: 65 in
MV M vel: 4.69 m/s
MV Peak grad: 88 mmHg
S' Lateral: 2.6 cm
Weight: 2080 [oz_av]

## 2024-05-14 LAB — CBC
HCT: 39.6 % (ref 36.0–46.0)
Hemoglobin: 12.6 g/dL (ref 12.0–15.0)
MCH: 27.2 pg (ref 26.0–34.0)
MCHC: 31.8 g/dL (ref 30.0–36.0)
MCV: 85.3 fL (ref 80.0–100.0)
Platelets: 361 K/uL (ref 150–400)
RBC: 4.64 MIL/uL (ref 3.87–5.11)
RDW: 15.6 % — ABNORMAL HIGH (ref 11.5–15.5)
WBC: 8 K/uL (ref 4.0–10.5)
nRBC: 0 % (ref 0.0–0.2)

## 2024-05-14 LAB — TSH: TSH: 3.53 u[IU]/mL (ref 0.350–4.500)

## 2024-05-14 LAB — GLUCOSE, CAPILLARY: Glucose-Capillary: 96 mg/dL (ref 70–99)

## 2024-05-14 NOTE — Care Management Obs Status (Addendum)
 MEDICARE OBSERVATION STATUS NOTIFICATION   Patient Details  Name: Brenda Lang MRN: [id number] Date of Birth: 12-Dec-1935   Medicare Observation Status Notification Given:    Spoke with Sanford Fish in the patient room and advised that the letter will  be mailed.    Allisyn Kunz 05/14/2024, 1:56 PM

## 2024-05-14 NOTE — Care Management Obs Status (Signed)
 MEDICARE OBSERVATION STATUS NOTIFICATION   Patient Details  Name: Brenda Lang MRN: [id number] Date of Birth: [birth year]   Medicare Observation Status Notification Given:  Yes Verbally reviewed observation notice with Sanford Fish at [phone]. A copy will be mailed to the patient home address.    Danay Mckellar 05/14/2024, 3:04 PM

## 2024-05-14 NOTE — Evaluation (Signed)
 Physical Therapy Evaluation Patient Details Name: Brenda Lang MRN: [id number] DOB: [birth year] Today's Date: 05/14/2024  History of Present Illness  Pt is an 88 y/o F admitted on 05/13/24 after presenting with c/o near syncope at church when she stood, felt lightheaded & had some jerking in all 4 extremities & had to sit down. Pt also noted to incidentally have UTI. PMH: arthritis, heart murmur, knee pain, bradycardia, L TKA (2017)  Clinical Impression  Pt seen for PT evaluation with pt agreeable, family present. Pt reports prior to admission she was independent without AD, driving, denies falls. On this date, pt is able to ambulate in hallway without AD with mod I, no overt LOB, no c/o symptoms during session. Pt appears to be near or at baseline level of function & does not require acute PT services. Pt encouraged to ambulate while in hospital. PT to complete current orders, please re-consult if new needs arise.  BP checked in LUE  BP HR  Supine  149/77 mmHg MAP 100 66 bpm  Sitting 138/90 mmHg MAP 102 84 bpm  Standing at 0 146/95 mmHg MAP 109 91 bpm  Sitting EOB after gait 163/113 mmHg MAP 128 94 bpm  Rechecked sitting EOB after gait 173/86 mmHg MAP 110 75 bpm           If plan is discharge home, recommend the following:     Can travel by private vehicle        Equipment Recommendations None recommended by PT  Recommendations for Other Services       Functional Status Assessment Patient has not had a recent decline in their functional status     Precautions / Restrictions Precautions Precautions: None Restrictions Weight Bearing Restrictions Per Provider Order: No      Mobility  Bed Mobility Overal bed mobility: Modified Independent (supine>sit to exit R side of bed, HOB slightly elevated)                  Transfers Overall transfer level: Independent Equipment used: None Transfers: Sit to/from Stand Sit to Stand: Independent                 Ambulation/Gait Ambulation/Gait assistance: Modified independent (Device/Increase time) Gait Distance (Feet):  (>200 ft) Assistive device: None Gait Pattern/deviations: Step-through pattern, Decreased stride length Gait velocity: slightly decreased     General Gait Details: slight lateral sway but pt able to recover without LOB  Stairs            Wheelchair Mobility     Tilt Bed    Modified Rankin (Stroke Patients Only)       Balance Overall balance assessment: Needs assistance Sitting-balance support: Feet supported Sitting balance-Leahy Scale: Normal Sitting balance - Comments: dons shoes sitting EOB without LOB   Standing balance support: During functional activity, No upper extremity supported Standing balance-Leahy Scale: Good                               Pertinent Vitals/Pain Pain Assessment Pain Assessment: No/denies pain    Home Living Family/patient expects to be discharged to:: Private residence Living Arrangements: Spouse/significant other Available Help at Discharge: Family Type of Home: House Home Access: Stairs to enter Entrance Stairs-Rails: Right Entrance Stairs-Number of Steps: 3   Home Layout: Multi-level;Able to live on main level with bedroom/bathroom Home Equipment: [place] - single point      Prior Function Prior Level of Function :  Driving;Independent/Modified Independent             Mobility Comments: Ambulatory without AD, denies falls ADLs Comments: Independent     Extremity/Trunk Assessment   Upper Extremity Assessment Upper Extremity Assessment: Overall WFL for tasks assessed    Lower Extremity Assessment Lower Extremity Assessment: Overall WFL for tasks assessed    Cervical / Trunk Assessment Cervical / Trunk Assessment: Normal  Communication   Communication Communication: Impaired Factors Affecting Communication: Hearing impaired (slight hearing impairment)    Cognition Arousal:  Alert Behavior During Therapy: WFL for tasks assessed/performed   PT - Cognitive impairments: No apparent impairments                       PT - Cognition Comments: very pleasant lady Following commands: Intact       Cueing Cueing Techniques: Verbal cues     General Comments      Exercises     Assessment/Plan    PT Assessment Patient does not need any further PT services  PT Problem List         PT Treatment Interventions      PT Goals (Current goals can be found in the Care Plan section)  Acute Rehab PT Goals Patient Stated Goal: figure out what's going on, go home PT Goal Formulation: All assessment and education complete, DC therapy Time For Goal Achievement: 05/21/24 Potential to Achieve Goals: Good    Frequency       Co-evaluation               AM-PAC PT 6 Clicks Mobility  Outcome Measure Help needed turning from your back to your side while in a flat bed without using bedrails?: None Help needed moving from lying on your back to sitting on the side of a flat bed without using bedrails?: None Help needed moving to and from a bed to a chair (including a wheelchair)?: None Help needed standing up from a chair using your arms (e.g., wheelchair or bedside chair)?: None Help needed to walk in hospital room?: None Help needed climbing 3-5 steps with a railing? : None 6 Click Score: 24    End of Session   Activity Tolerance: Patient tolerated treatment well Patient left: in chair;with call bell/phone within reach Nurse Communication: Mobility status      Time: 1001-1017 PT Time Calculation (min) (ACUTE ONLY): 16 min   Charges:   PT Evaluation $PT Eval Low Complexity: 1 Low   PT General Charges $$ ACUTE PT VISIT: 1 Visit         Richerd Pinal, PT, DPT 05/14/24, 10:27 AM   Richerd CHRISTELLA Pinal 05/14/2024, 10:22 AM

## 2024-05-14 NOTE — Progress Notes (Signed)
 Transition of Care [facility]) - Inpatient Brief Assessment   Patient Details  Name: Brenda Lang MRN: [id number] Date of Birth: 12-02-1935  [facility] of [facility]) CM/SW Contact:    Rosaline JONELLE Joe, RN Phone Number: 05/14/2024, 4:37 PM   Clinical Narrative: Patient admitted from home with syncopal episode.  Patient lives with family at home.  DME at the home includes [place].  No IP Care management needs at this time.   Transition of Care Asessment: Insurance and Status: (P) Insurance coverage has been reviewed Patient has primary care physician: (P) Yes Home environment has been reviewed: (P) from home with family Prior level of function:: (P) self [employer] Home Services: (P) No current home services Social Drivers of Health Review: (P) SDOH reviewed no interventions necessary Readmission risk has been reviewed: (P) Yes Transition of care needs: (P) no transition of care needs at this time

## 2024-05-14 NOTE — Plan of Care (Signed)
  Problem: Education: Goal: Knowledge of condition and prescribed therapy will improve Outcome: Progressing   

## 2024-05-14 NOTE — Progress Notes (Signed)
 PROGRESS NOTE    Brenda Lang  FMW:[id number] DOB: [birth year] DOA: 05/13/2024 PCP: Gayl Males, MD   Brief Narrative:   88 y.o. female with medical history significant of bradycardia presenting with near syncope. No evidence of neuro deficits. CT head is unremarkable. Pending ECHO and PT evaluation. Being treated for possible mild UTI as well.   Assessment & Plan:  Principal Problem:   Near syncope Active Problems:   Bradycardia   UTI (urinary tract infection)   Near syncope,POA: Likely in the setting of dehydration with possible mild UTI. -Tele monitoring - Echocardiogram pending - Received IV fluids - Check orthostatic vital signs - Supportive care -PT OT eval -CT head didn't show any acute abnrormalities.   Possible mild acute uncomplicated UTI -Incidentally noted to have large leukocytes with many bacteria and also had some urinary frequency in the ED. - Started on ceftriaxone  in the ED, will continue - Follow-up urine cultures   Physical deconditioning: PT eval  Disposition: Home. Lives with her husband.    DVT prophylaxis: enoxaparin  (LOVENOX ) injection 40 mg Start: 05/14/24 0800     Code Status: Full Code Family Communication:   Status is: Observation The patient remains OBS appropriate and will d/c before 2 midnights.    Subjective:  No acute events overnight. Denies any presyncope/syncope episodes in the hospital. Daughter and husband are at the bedside. Getting ECHO.  Examination:  General exam: Appears calm and comfortable  Respiratory system: Clear to auscultation. Respiratory effort normal. Cardiovascular system: S1 & S2 heard, RRR. No JVD, murmurs, rubs, gallops or clicks. No pedal edema. Gastrointestinal system: Abdomen is nondistended, soft and nontender. No organomegaly or masses felt. Normal bowel sounds heard. Central nervous system: Alert and oriented. No focal neurological deficits. Extremities: Symmetric 5 x 5 power. Skin: No  rashes, lesions or ulcers Psychiatry: Judgement and insight appear normal. Mood & affect appropriate.       Diet Orders (From admission, onward)     Start     Ordered   05/13/24 1611  Diet regular Room service appropriate? Yes; Fluid consistency: Thin  Diet effective now       Question Answer Comment  Room service appropriate? Yes   Fluid consistency: Thin      05/13/24 1610            Objective: Vitals:   05/13/24 1500 05/13/24 1945 05/14/24 0025 05/14/24 0358  BP: (!) 150/86 (!) 164/97 (!) 124/94 118/75  Pulse: (!) 54 (!) 56 61 65  Resp: (!) 24 16 16 16   Temp: 97.8 F (36.6 C) 98.9 F (37.2 C) 98 F (36.7 C) 98 F (36.7 C)  TempSrc:  Oral Oral Oral  SpO2: 100% 98% 98% 99%  Weight:      Height:        Intake/Output Summary (Last 24 hours) at 05/14/2024 9062 Last data filed at 05/13/2024 1614 Gross per 24 hour  Intake 200 ml  Output 300 ml  Net -100 ml   Filed Weights   05/13/24 1022  Weight: 59 kg    Scheduled Meds:  enoxaparin  (LOVENOX ) injection  40 mg Subcutaneous Q24H   sodium chloride  flush  3 mL Intravenous Q12H   Continuous Infusions:  cefTRIAXone  (ROCEPHIN )  IV      Nutritional status     Body mass index is 21.63 kg/m.  Data Reviewed:   CBC: Recent Labs  Lab 05/13/24 1038 05/14/24 0208  WBC 8.1 8.0  HGB 13.1 12.6  HCT 40.1 39.6  MCV  85.3 85.3  PLT 349 361   Basic Metabolic Panel: Recent Labs  Lab 05/13/24 1038 05/14/24 0208  NA 136 140  K 4.3 4.1  CL 104 110  CO2 24 25  GLUCOSE 97 94  BUN 8 8  CREATININE 0.95 0.95  CALCIUM 9.2 8.3*   GFR: Estimated Creatinine Clearance: 39 mL/min (by C-G formula based on SCr of 0.95 mg/dL). Liver Function Tests: Recent Labs  Lab 05/13/24 1038 05/14/24 0208  AST 20 14*  ALT 11 9  ALKPHOS 50 45  BILITOT 0.8 0.6  PROT 5.9* 5.4*  ALBUMIN 3.1* 2.8*   No results for input(s): LIPASE, AMYLASE in the last 168 hours. No results for input(s): AMMONIA in the last 168  hours. Coagulation Profile: No results for input(s): INR, PROTIME in the last 168 hours. Cardiac Enzymes: No results for input(s): CKTOTAL, CKMB, CKMBINDEX, TROPONINI in the last 168 hours. BNP (last 3 results) No results for input(s): PROBNP in the last 8760 hours. HbA1C: No results for input(s): HGBA1C in the last 72 hours. CBG: Recent Labs  Lab 05/13/24 1036 05/14/24 0729  GLUCAP 93 96   Lipid Profile: No results for input(s): CHOL, HDL, LDLCALC, TRIG, CHOLHDL, LDLDIRECT in the last 72 hours. Thyroid  Function Tests: No results for input(s): TSH, T4TOTAL, FREET4, T3FREE, THYROIDAB in the last 72 hours. Anemia Panel: No results for input(s): VITAMINB12, FOLATE, FERRITIN, TIBC, IRON, RETICCTPCT in the last 72 hours. Sepsis Labs: No results for input(s): PROCALCITON, LATICACIDVEN in the last 168 hours.  No results found for this or any previous visit (from the past 240 hours).       Radiology Studies: CT Head Wo Contrast Result Date: 05/13/2024 EXAM: CT HEAD WITHOUT CONTRAST 05/13/2024 11:59:02 AM TECHNIQUE: CT of the head was performed without the administration of intravenous contrast. Automated exposure control, iterative reconstruction, and/or weight based adjustment of the mA/kV was utilized to reduce the radiation dose to as low as reasonably achievable. COMPARISON: 12/16/2022 CLINICAL HISTORY: Mental status change, unknown cause. Pt bib ems from church, started to feel dizziness and had a near syncopal episode. Pt having tremors since then. Stroke screen negative with ems. FINDINGS: BRAIN AND VENTRICLES: No acute hemorrhage. Gray-white differentiation is preserved. No hydrocephalus. No extra-axial collection. No mass effect or midline shift. Prominence of the sulci and ventricles compatible with brain atrophy. Hypoattenuating foci in the cerebral white matter, most likely representing chronic small vessel disease. ORBITS: No  acute abnormality. SINUSES: No acute abnormality. SOFT TISSUES AND SKULL: No acute soft tissue abnormality. No skull fracture. IMPRESSION: 1. No acute intracranial abnormality. 2. Prominence of the sulci and ventricles compatible with brain atrophy. 3. Hypoattenuating foci in the cerebral white matter, most likely representing chronic small vessel disease. Electronically signed by: Waddell Calk MD 05/13/2024 12:20 PM EDT RP Workstation: [id number]           LOS: 0 days   Time spent= 39 mins    Deliliah Room, MD [facility]  If 7PM-7AM, please contact night-coverage  05/14/2024, 9:37 AM

## 2024-05-14 NOTE — Progress Notes (Signed)
*  PRELIMINARY RESULTS* Echocardiogram 2D Echocardiogram has been performed.  Benard FORBES Stallion 05/14/2024, 9:59 AM

## 2024-05-14 NOTE — Plan of Care (Signed)
 Patient calm and cooperative A&O X2, family at bedside. PIV in left upper arm maintained. Patient left resting peacefully with respirations at 16.   Problem: Education: Goal: Knowledge of condition and prescribed therapy will improve Outcome: Progressing   Problem: Physical Regulation: Goal: Complications related to the disease process, condition or treatment will be avoided or minimized Outcome: Progressing   Problem: Education: Goal: Knowledge of General Education information will improve Description: Including pain rating scale, medication(s)/side effects and non-pharmacologic comfort measures Outcome: Progressing   Problem: Health Behavior/Discharge Planning: Goal: Ability to manage health-related needs will improve Outcome: Progressing   Problem: Coping: Goal: Level of anxiety will decrease Outcome: Progressing   Problem: Pain Managment: Goal: General experience of comfort will improve and/or be controlled Outcome: Progressing   Problem: Safety: Goal: Ability to remain free from injury will improve Outcome: Progressing   Problem: Elimination: Goal: Will not experience complications related to urinary retention Outcome: Progressing

## 2024-05-15 ENCOUNTER — Ambulatory Visit: Payer: Self-pay | Admitting: Internal Medicine

## 2024-05-15 DIAGNOSIS — R55 Syncope and collapse: Secondary | ICD-10-CM | POA: Diagnosis not present

## 2024-05-15 DIAGNOSIS — R001 Bradycardia, unspecified: Secondary | ICD-10-CM | POA: Diagnosis not present

## 2024-05-15 DIAGNOSIS — N390 Urinary tract infection, site not specified: Secondary | ICD-10-CM | POA: Diagnosis not present

## 2024-05-15 LAB — GLUCOSE, CAPILLARY: Glucose-Capillary: 96 mg/dL (ref 70–99)

## 2024-05-15 MED ORDER — CEFDINIR 300 MG PO CAPS
300.0000 mg | ORAL_CAPSULE | Freq: Two times a day (BID) | ORAL | 0 refills | Status: DC
Start: 2024-05-15 — End: 2024-05-30

## 2024-05-15 NOTE — Progress Notes (Signed)
 Patient heart rate on tel monitor has been running btwn 35-45, EKG completed and message send to on call doctor.  As per Dr Keturah - looks like sinus brady without any other heart block. not on any nodal blocking agents so no change in mgmt for now. If not already I would have him up only with assistance and fall precautions.

## 2024-05-15 NOTE — Discharge Summary (Signed)
 Physician Discharge Summary   Patient: Brenda Lang MRN: [id number] DOB: 1936/09/20  Admit date:     05/13/2024  Discharge date: 05/15/24  Discharge Physician: Brenda Lang   PCP: Brenda Males, MD   Recommendations at discharge:    Follow up with your PCP in one week Check BP and HR daily Follow up with cardiology in one month. Continue taking meds  Discharge Diagnoses: Principal Problem:   Near syncope Active Problems:   Bradycardia   UTI (urinary tract infection)    Hospital Course:  Near syncope,POA: Likely in the setting of dehydration with possible mild UTI and sinus bradycardia. -No arrhythmias on tele monitoring. - Echocardiogram showed normal EF and grade 1 diastolic dysfunction. No significant valvular abnormalities to explain near syncope/syncope. - Received IV fluids - Negative for orthostatic hypotension -CT head didn't show any acute abnrormalities. -No near syncope/syncope episodes in the hospital. -She does have sinus bradycardia and I spoke to her, her husband and daughter at the bedside to check her BP and HR daily. Out patient follow up with cardiology in 3-4 weeks.  -Normal TSH   Possible mild acute uncomplicated UTI -Incidentally noted to have large leukocytes with many bacteria and also had some urinary frequency in the ED. - Started on ceftriaxone  in the ED, dced on oral cefdinir . - Follow-up urine cultures-pending   Bradycardia: No heart blocks. Normal TSH. Unremarkable ECHO. Out patient follow up with cardiology. Not on any negative chronotropic agents.   Disposition: Home. Lives with her husband. IADL, walks 1-2 miles per day.      Consultants: None Procedures performed: None  Disposition: Home Diet recommendation:  Regular diet DISCHARGE MEDICATION: Allergies as of 05/15/2024       Reactions   Bactrim Rash        Medication List     TAKE these medications    cefdinir  300 MG capsule Commonly known as: OMNICEF  Take 1  capsule (300 mg total) by mouth 2 (two) times daily.   PREVAGEN PO Take 1 capsule by mouth daily.        Follow-up Information     Brenda Males, MD. Schedule an appointment as soon as possible for a visit in 1 week(s).   Specialty: Family Medicine Contact information: [address] [place] [address] [phone]         [facility]. Schedule an appointment as soon as possible for a visit in 1 month(s).   Contact information: [address] [place]  [address] [phone]               Discharge Exam: Brenda Lang   05/13/24 1022 05/15/24 0454  Weight: 59 kg 64.8 kg   Constitutional: NAD, calm, comfortable Eyes: PERRL, lids and conjunctivae normal ENMT: Mucous membranes are moist. Posterior pharynx clear of any exudate or lesions.Normal dentition.  Neck: normal, supple, no masses, no thyromegaly Respiratory: clear to auscultation bilaterally, no wheezing, no crackles. Normal respiratory effort. No accessory muscle use.  Cardiovascular: Regular rate and rhythm, no murmurs / rubs / gallops. No extremity edema. 2+ pedal pulses. No carotid bruits.  Abdomen: no tenderness, no masses palpated. No hepatosplenomegaly. Bowel sounds positive.  Musculoskeletal: no clubbing / cyanosis. No joint deformity upper and lower extremities. Good ROM, no contractures. Normal muscle tone.  Skin: no rashes, lesions, ulcers. No induration Neurologic: CN 2-12 grossly intact. Sensation intact, DTR normal. Strength 5/5 x all 4 extremities.  Psychiatric: Normal judgment and insight. Alert and oriented x 3.  Normal mood.    Condition at discharge: good  The results of significant diagnostics from this hospitalization (including imaging, microbiology, ancillary and laboratory) are listed below for reference.   Imaging Studies: ECHOCARDIOGRAM COMPLETE Result Date: 05/14/2024    ECHOCARDIOGRAM REPORT   Patient Name:   Brenda Lang  Date of Exam: 05/14/2024 Medical Rec #:  [id number]        Height:       65.0 in Accession #:    [id number]       Weight:       130.0 lb Date of Birth:  Dec 31, 1935        BSA:          1.647 m Patient Age:    88 years         BP:           118/75 mmHg Patient Gender: F                HR:           70 bpm. Exam Location:  Inpatient Procedure: 2D Echo, Color Doppler and Cardiac Doppler (Both Spectral and Color            Flow Doppler were utilized during procedure). Indications:    other  History:        Patient has no prior history of Echocardiogram examinations.                 Arrythmias:Bradycardia.  Sonographer:    Brenda Lang: [id number] Brenda Lang IMPRESSIONS  1. Left ventricular ejection fraction, by estimation, is 55 to 60%. The left ventricle has normal function. The left ventricle has no regional wall motion abnormalities. Left ventricular diastolic parameters are consistent with Grade I diastolic dysfunction (impaired relaxation).  2. Right ventricular systolic function is normal. The right ventricular size is normal. Tricuspid regurgitation signal is inadequate for assessing PA pressure.  3. The mitral valve is normal in structure. Trivial mitral valve regurgitation. No evidence of mitral stenosis.  4. The aortic valve is tricuspid. There is mild calcification of the aortic valve. Aortic valve regurgitation is not visualized. No aortic stenosis is present.  5. IVC not visualized.  6. Patient did not complete exam. FINDINGS  Left Ventricle: Left ventricular ejection fraction, by estimation, is 55 to 60%. The left ventricle has normal function. The left ventricle has no regional wall motion abnormalities. The left ventricular internal cavity size was normal in size. There is  no left ventricular hypertrophy. Left ventricular diastolic parameters are consistent with Grade I diastolic dysfunction (impaired relaxation). Right Ventricle: Peak RV-RA gradient 17 mmHg. The right ventricular  size is normal. No increase in right ventricular wall thickness. Right ventricular systolic function is normal. Tricuspid regurgitation signal is inadequate for assessing PA pressure. Left Atrium: Left atrial size was normal in size. Right Atrium: Right atrial size was normal in size. Pericardium: There is no evidence of pericardial effusion. Mitral Valve: The mitral valve is normal in structure. Trivial mitral valve regurgitation. No evidence of mitral valve stenosis. Tricuspid Valve: The tricuspid valve is normal in structure. Tricuspid valve regurgitation is trivial. Aortic Valve: The aortic valve is tricuspid. There is mild calcification of the aortic valve. Aortic valve regurgitation is not visualized. No aortic stenosis is present. Aortic valve mean gradient measures 4.0 mmHg. Aortic valve peak gradient measures 6.9 mmHg. Aortic valve area, by VTI measures 3.20 cm. Pulmonic Valve: The pulmonic valve was normal in structure. Pulmonic valve  regurgitation is not visualized. Aorta: The aortic root is normal in size and structure. Venous: IVC not visualized. IAS/Shunts: No atrial level shunt detected by color flow Doppler.  LEFT VENTRICLE PLAX 2D LVIDd:         4.40 cm   Diastology LVIDs:         2.60 cm   LV e' medial:    3.59 cm/s LV PW:         0.90 cm   LV E/e' medial:  14.5 LV IVS:        1.00 cm   LV e' lateral:   5.55 cm/s LVOT diam:     2.10 cm   LV E/e' lateral: 9.4 LV SV:         84 LV SV Index:   51 LVOT Area:     3.46 cm  RIGHT VENTRICLE RV Basal diam:  2.60 cm RV Mid diam:    2.20 cm RV S prime:     14.60 cm/s LEFT ATRIUM           Index        RIGHT ATRIUM          Index LA diam:      2.60 cm 1.58 cm/m   RA Area:     9.66 cm LA Vol (A2C): 36.8 ml 22.34 ml/m  RA Volume:   16.60 ml 10.08 ml/m LA Vol (A4C): 20.9 ml 12.69 ml/m  AORTIC VALVE AV Area (Vmax):    3.07 cm AV Area (Vmean):   2.87 cm AV Area (VTI):     3.20 cm AV Vmax:           131.00 cm/s AV Vmean:          91.400 cm/s AV VTI:             0.262 m AV Peak Grad:      6.9 mmHg AV Mean Grad:      4.0 mmHg LVOT Vmax:         116.00 cm/s LVOT Vmean:        75.800 cm/s LVOT VTI:          0.242 m LVOT/AV VTI ratio: 0.92  AORTA Ao Root diam: 3.30 cm MITRAL VALVE                TRICUSPID VALVE MV Area (PHT): 3.72 cm     TR Peak grad:   17.6 mmHg MV Decel Time: 204 msec     TR Vmax:        210.00 cm/s MR Peak grad: 88.0 mmHg MR Vmax:      469.00 cm/s   SHUNTS MV E velocity: 52.10 cm/s   Systemic VTI:  0.24 m MV A velocity: 103.00 cm/s  Systemic Diam: 2.10 cm MV E/A ratio:  0.51 Dalton McleanMD Electronically signed by Ezra Kanner Signature Date/Time: 05/14/2024/6:34:07 PM    Final    CT Head Wo Contrast Result Date: 05/13/2024 EXAM: CT HEAD WITHOUT CONTRAST 05/13/2024 11:59:02 AM TECHNIQUE: CT of the head was performed without the administration of intravenous contrast. Automated exposure control, iterative reconstruction, and/or weight based adjustment of the mA/kV was utilized to reduce the radiation dose to as low as reasonably achievable. COMPARISON: 12/16/2022 CLINICAL HISTORY: Mental status change, unknown cause. Pt bib ems from church, started to feel dizziness and had a near syncopal episode. Pt having tremors since then. Stroke screen negative with ems. FINDINGS: BRAIN AND VENTRICLES: No acute hemorrhage. Gray-white differentiation is preserved.  No hydrocephalus. No extra-axial collection. No mass effect or midline shift. Prominence of the sulci and ventricles compatible with brain atrophy. Hypoattenuating foci in the cerebral white matter, most likely representing chronic small vessel disease. ORBITS: No acute abnormality. SINUSES: No acute abnormality. SOFT TISSUES AND SKULL: No acute soft tissue abnormality. No skull fracture. IMPRESSION: 1. No acute intracranial abnormality. 2. Prominence of the sulci and ventricles compatible with brain atrophy. 3. Hypoattenuating foci in the cerebral white matter, most likely representing chronic small  vessel disease. Electronically signed by: Waddell Calk MD 05/13/2024 12:20 PM EDT RP Workstation: [id number]    Microbiology: Results for orders placed or performed during the hospital encounter of 03/11/16  Urine culture     Status: Abnormal   Collection Time: 03/11/16 10:27 AM   Specimen: Urine, Clean Catch  Result Value Ref Range Status   Specimen Description URINE, CLEAN CATCH  Final   Special Requests ADDED [phone] [id number]  Final   Culture MULTIPLE SPECIES PRESENT, SUGGEST RECOLLECTION (A)  Final   Report Status 03/12/2016 FINAL  Final  Surgical pcr screen     Status: Abnormal   Collection Time: 03/11/16 10:28 AM   Specimen: Nasal Mucosa; Nasal Swab  Result Value Ref Range Status   MRSA, PCR NEGATIVE NEGATIVE Final   Staphylococcus aureus POSITIVE (A) NEGATIVE Final    Comment:        The Xpert SA Assay (FDA approved for NASAL specimens in patients over 63 years of age), is one component of a comprehensive surveillance program.  Test performance has been validated by [facility] for patients greater than or equal to 68 year old. It is not intended to diagnose infection nor to guide or monitor treatment.     Labs: CBC: Recent Labs  Lab 05/13/24 1038 05/14/24 0208  WBC 8.1 8.0  HGB 13.1 12.6  HCT 40.1 39.6  MCV 85.3 85.3  PLT 349 361   Basic Metabolic Panel: Recent Labs  Lab 05/13/24 1038 05/14/24 0208  NA 136 140  K 4.3 4.1  CL 104 110  CO2 24 25  GLUCOSE 97 94  BUN 8 8  CREATININE 0.95 0.95  CALCIUM 9.2 8.3*   Liver Function Tests: Recent Labs  Lab 05/13/24 1038 05/14/24 0208  AST 20 14*  ALT 11 9  ALKPHOS 50 45  BILITOT 0.8 0.6  PROT 5.9* 5.4*  ALBUMIN 3.1* 2.8*   CBG: Recent Labs  Lab 05/13/24 1036 05/14/24 0729 05/15/24 0456  GLUCAP 93 96 96    Discharge time spent: 43 minutes.  Signed: Deliliah Room, MD [facility] 05/15/2024

## 2024-05-15 NOTE — Plan of Care (Signed)

## 2024-05-16 LAB — URINE CULTURE: Culture: >=100000 — AB

## 2024-05-17 DIAGNOSIS — I10 Essential (primary) hypertension: Secondary | ICD-10-CM | POA: Diagnosis not present

## 2024-05-30 ENCOUNTER — Other Ambulatory Visit (HOSPITAL_BASED_OUTPATIENT_CLINIC_OR_DEPARTMENT_OTHER): Payer: Self-pay

## 2024-05-30 ENCOUNTER — Encounter (HOSPITAL_BASED_OUTPATIENT_CLINIC_OR_DEPARTMENT_OTHER): Payer: Self-pay

## 2024-05-30 ENCOUNTER — Ambulatory Visit (HOSPITAL_BASED_OUTPATIENT_CLINIC_OR_DEPARTMENT_OTHER)
Admission: RE | Admit: 2024-05-30 | Discharge: 2024-05-30 | Disposition: A | Discharge status: Home / Self Care | Source: Ambulatory Visit | Attending: Family Medicine | Admitting: Family Medicine

## 2024-05-30 ENCOUNTER — Telehealth (HOSPITAL_BASED_OUTPATIENT_CLINIC_OR_DEPARTMENT_OTHER): Payer: Self-pay | Admitting: Family Medicine

## 2024-05-30 VITALS — BP 159/90 | HR 92 | Temp 98.2°F | Resp 20

## 2024-05-30 DIAGNOSIS — Z79899 Other long term (current) drug therapy: Secondary | ICD-10-CM | POA: Diagnosis not present

## 2024-05-30 DIAGNOSIS — F02B18 Dementia in other diseases classified elsewhere, moderate, with other behavioral disturbance: Secondary | ICD-10-CM | POA: Diagnosis not present

## 2024-05-30 DIAGNOSIS — Z87440 Personal history of urinary (tract) infections: Secondary | ICD-10-CM | POA: Insufficient documentation

## 2024-05-30 DIAGNOSIS — R300 Dysuria: Secondary | ICD-10-CM | POA: Diagnosis not present

## 2024-05-30 DIAGNOSIS — T50901A Poisoning by unspecified drugs, medicaments and biological substances, accidental (unintentional), initial encounter: Secondary | ICD-10-CM | POA: Diagnosis not present

## 2024-05-30 DIAGNOSIS — N390 Urinary tract infection, site not specified: Secondary | ICD-10-CM | POA: Diagnosis not present

## 2024-05-30 DIAGNOSIS — X58XXXA Exposure to other specified factors, initial encounter: Secondary | ICD-10-CM | POA: Diagnosis not present

## 2024-05-30 DIAGNOSIS — G309 Alzheimer's disease, unspecified: Secondary | ICD-10-CM | POA: Diagnosis not present

## 2024-05-30 LAB — POCT URINE DIPSTICK
Bilirubin, UA: NEGATIVE
Glucose, UA: NEGATIVE mg/dL
Ketones, POC UA: NEGATIVE mg/dL
Nitrite, UA: POSITIVE — AB
POC PROTEIN,UA: NEGATIVE
Spec Grav, UA: 1.02 (ref 1.010–1.025)
Urobilinogen, UA: 0.2 U/dL
pH, UA: 6 (ref 5.0–8.0)

## 2024-05-30 MED ORDER — CEPHALEXIN 500 MG PO CAPS
500.0000 mg | ORAL_CAPSULE | Freq: Two times a day (BID) | ORAL | 0 refills | Status: AC
  Filled 2024-05-30: qty 14, 7d supply, fill #0

## 2024-05-30 NOTE — Discharge Instructions (Addendum)
 Treating or UTI.  Take the antibiotics as prescribed.  Will call with any changes need to be made based on culture results. Drink plenty of fluids.

## 2024-05-30 NOTE — ED Triage Notes (Signed)
 Daughter reports pt has been having burning since Friday, pt has hx of UTI's, pt took Azo otc and it helped initially but has had returning discomfort. Last dose of Azo was early morning.

## 2024-05-30 NOTE — ED Provider Notes (Signed)
 [facility]    CSN: [id number] Arrival date & time: 05/30/24  1615      History   Chief Complaint Chief Complaint  Patient presents with   Urinary Frequency    Burning at vagina like UTI - Entered by patient   Dysuria    HPI Brenda Lang is a 88 y.o. female.   Patient is an 88 year old female who presents today with dysuria and urinary frequency.  Daughter reports pt has been having burning since Friday. History of UTI's.  She took Azo otc and it helped initially but has had returning discomfort. Last dose of Azo was early morning.  Patient's daughter with her and reporting some memory issues.    Urinary Frequency  Dysuria   Past Medical History:  Diagnosis Date   Arthritis    Heart murmur    Knee pain     Patient Active Problem List   Diagnosis Date Noted   Near syncope 05/13/2024   UTI (urinary tract infection) 05/13/2024   Lung nodule 05/24/2016   Thyroid  nodule 05/24/2016   S/P total knee replacement 03/22/2016   Chronic pain of left knee 03/10/2016   Preoperative cardiovascular examination 06/02/2011   Bradycardia 06/02/2011    Past Surgical History:  Procedure Laterality Date   ABDOMINAL HYSTERECTOMY     EYE SURGERY     TOTAL KNEE ARTHROPLASTY Left 03/22/2016   Procedure: LEFT TOTAL KNEE ARTHROPLASTY;  Surgeon: Marcey Raman, MD;  Location: [facility] OR;  Service: Orthopedics;  Laterality: Left;    OB History   No obstetric history on file.      Home Medications    Prior to Admission medications   Medication Sig Start Date End Date Taking? Authorizing Provider  cephALEXin  (KEFLEX ) 500 MG capsule Take 1 capsule (500 mg total) by mouth 2 (two) times daily for 7 days. 05/30/24 06/06/24 Yes Kenni Newton A, FNP  olmesartan (BENICAR) 20 MG tablet Take 20 mg by mouth daily. 05/17/24  Yes [provider]  Apoaequorin (PREVAGEN PO) Take 1 capsule by mouth daily.    [provider]    Family History History reviewed. No pertinent  family history.  Social History Social History   Tobacco Use   Smoking status: Never   Smokeless tobacco: Never  Substance Use Topics   Alcohol  use: No   Drug use: No     Allergies   Bactrim   Review of Systems Review of Systems  Genitourinary:  Positive for dysuria and frequency.     Physical Exam Triage Vital Signs ED Triage Vitals  Encounter Vitals Group     BP 05/30/24 1630 (!) 159/90     Girls Systolic BP Percentile --      Girls Diastolic BP Percentile --      Boys Systolic BP Percentile --      Boys Diastolic BP Percentile --      Pulse Rate 05/30/24 1630 92     Resp 05/30/24 1630 20     Temp 05/30/24 1630 98.2 F (36.8 C)     Temp Source 05/30/24 1630 Oral     SpO2 05/30/24 1630 97 %     Weight --      Height --      Head Circumference --      Peak Flow --      Pain Score 05/30/24 1633 5     Pain Loc --      Pain Education --      Exclude from  Growth Chart --    No data found.  Updated Vital Signs BP (!) 159/90 (BP Location: Right Arm)   Pulse 92   Temp 98.2 F (36.8 C) (Oral)   Resp 20   SpO2 97%   Visual Acuity Right Eye Distance:   Left Eye Distance:   Bilateral Distance:    Right Eye Near:   Left Eye Near:    Bilateral Near:     Physical Exam Vitals and nursing note reviewed.  Constitutional:      General: She is not in acute distress.    Appearance: Normal appearance. She is not ill-appearing, toxic-appearing or diaphoretic.  Pulmonary:     Effort: Pulmonary effort is normal.  Neurological:     Mental Status: She is alert.  Psychiatric:        Mood and Affect: Mood normal.      UC Treatments / Results  Labs (all labs ordered are listed, but only abnormal results are displayed) Labs Reviewed  POCT URINE DIPSTICK - Abnormal; Notable for the following components:      Result Value   Clarity, UA hazy (*)    Blood, UA trace-intact (*)    Nitrite, UA Positive (*)    Leukocytes, UA Trace (*)    All other components  within normal limits  URINE CULTURE    EKG   Radiology No results found.  Procedures Procedures (including critical care time)  Medications Ordered in UC Medications - No data to display  Initial Impression / Assessment and Plan / UC Course  I have reviewed the triage vital signs and the nursing notes.  Pertinent labs & imaging results that were available during my care of the patient were reviewed by me and considered in my medical decision making (see chart for details).     Dysuria-patient urine with trace leuks, positive nitrates, trace blood and hazy.  Consistent with urinary tract infection.  Will go ahead and treat with Keflex  at this time.  Culture pending.  Will call with any changes.  Recommended increase fluid intake.  Follow-up as needed Final Clinical Impressions(s) / UC Diagnoses   Final diagnoses:  Dysuria     Discharge Instructions      Treating or UTI.  Take the antibiotics as prescribed.  Will call with any changes need to be made based on culture results. Drink plenty of fluids.     ED Prescriptions     Medication Sig Dispense Auth. Provider   cephALEXin  (KEFLEX ) 500 MG capsule Take 1 capsule (500 mg total) by mouth 2 (two) times daily for 7 days. 14 capsule Adah Corning A, FNP      PDMP not reviewed this encounter.   Adah Corning LABOR, FNP 05/31/24 [phone]

## 2024-05-30 NOTE — Telephone Encounter (Signed)
 Pt unable to perform UA here. Sending home to obtain with future order.

## 2024-05-30 NOTE — ED Notes (Signed)
 Patient unable to provide urine specimen at this time. Water given to patient. Will attempt to obtain a specimen after finished with water.

## 2024-06-02 LAB — URINE CULTURE: Culture: >=100000 — AB

## 2024-06-04 ENCOUNTER — Ambulatory Visit: Payer: Self-pay

## 2024-06-12 ENCOUNTER — Ambulatory Visit (HOSPITAL_BASED_OUTPATIENT_CLINIC_OR_DEPARTMENT_OTHER): Admitting: Family Medicine

## 2024-06-12 ENCOUNTER — Encounter (HOSPITAL_BASED_OUTPATIENT_CLINIC_OR_DEPARTMENT_OTHER): Payer: Self-pay | Admitting: Family Medicine

## 2024-06-12 ENCOUNTER — Other Ambulatory Visit (HOSPITAL_BASED_OUTPATIENT_CLINIC_OR_DEPARTMENT_OTHER): Payer: Self-pay

## 2024-06-12 VITALS — BP 146/83 | HR 60 | Temp 97.7°F | Resp 16 | Ht 64.57 in | Wt 139.4 lb

## 2024-06-12 DIAGNOSIS — I10 Essential (primary) hypertension: Secondary | ICD-10-CM | POA: Insufficient documentation

## 2024-06-12 DIAGNOSIS — R413 Other amnesia: Secondary | ICD-10-CM | POA: Insufficient documentation

## 2024-06-12 DIAGNOSIS — R011 Cardiac murmur, unspecified: Secondary | ICD-10-CM | POA: Insufficient documentation

## 2024-06-12 DIAGNOSIS — D510 Vitamin B12 deficiency anemia due to intrinsic factor deficiency: Secondary | ICD-10-CM | POA: Diagnosis not present

## 2024-06-12 DIAGNOSIS — M1990 Unspecified osteoarthritis, unspecified site: Secondary | ICD-10-CM | POA: Insufficient documentation

## 2024-06-12 DIAGNOSIS — M1991 Primary osteoarthritis, unspecified site: Secondary | ICD-10-CM | POA: Diagnosis not present

## 2024-06-12 DIAGNOSIS — E559 Vitamin D deficiency, unspecified: Secondary | ICD-10-CM | POA: Diagnosis not present

## 2024-06-12 MED ORDER — CYANOCOBALAMIN 1000 MCG/ML IJ SOLN
1000.0000 ug | Freq: Once | INTRAMUSCULAR | Status: AC
  Administered 2024-06-12: 1000 ug via INTRAMUSCULAR

## 2024-06-12 MED ORDER — OLMESARTAN MEDOXOMIL 20 MG PO TABS
20.0000 mg | ORAL_TABLET | Freq: Every day | ORAL | 1 refills | Status: AC
  Filled 2024-06-12: qty 90, 90d supply, fill #0
  Filled 2024-08-07 – 2024-08-27 (×2): qty 90, 90d supply, fill #1

## 2024-06-12 NOTE — Progress Notes (Signed)
 Established Patient Office Visit  Subjective   Patient ID: Brenda Lang, female    DOB: [birth year]  Age: 88 y.o. MRN: [id number]  Chief Complaint  Patient presents with   Establish Care    Establish Care     F/u as above.  Known to [facility], but new to my practice.  Her daughter also states the patient has had years of gradual memory loss.  No concerns about depression after an extended discussion.  She used to get B12 shots but hasn't had one in about one year.  We reviewed the importance of compliance with this today.  Note her recent Head scan at [facility] earlier this year.    Past Medical History:  Diagnosis Date   Colonoscopy refused    and now of questionable value   History of UTI    Recurring, with Urology encouraged.   Hypertension    Memory loss    Workup underway   Osteoarthritis    Pernicious anemia     Outpatient Encounter Medications as of 06/12/2024  Medication Sig   Apoaequorin (PREVAGEN PO) Take 1 capsule by mouth daily.   olmesartan  (BENICAR ) 20 MG tablet Take 1 tablet (20 mg total) by mouth daily.   [DISCONTINUED] olmesartan  (BENICAR ) 20 MG tablet Take 20 mg by mouth daily.   [EXPIRED] cyanocobalamin  (VITAMIN B12) injection 1,000 mcg    No facility-administered encounter medications on file as of 06/12/2024.    Social History   Tobacco Use   Smoking status: Never   Smokeless tobacco: Never  Substance Use Topics   Alcohol  use: No   Drug use: Never      Review of Systems  Constitutional:  Negative for diaphoresis, fever, malaise/fatigue and weight loss.  Respiratory:  Negative for cough, shortness of breath and wheezing.   Cardiovascular:  Negative for chest pain, palpitations, orthopnea, claudication, leg swelling and PND.      Objective:     BP (!) 146/83 (BP Location: Left Arm, Patient Position: Sitting, Cuff Size: Normal)   Pulse 60   Temp 97.7 F (36.5 C) (Oral)   Resp 16   Ht 5' 4.57 (1.64 m)   Wt 139 lb 6.4 oz (63.2  kg)   BMI 23.51 kg/m    Physical Exam Constitutional:      General: She is not in acute distress.    Appearance: Normal appearance.  HENT:     Head: Normocephalic.  Neck:     Vascular: No carotid bruit.  Cardiovascular:     Rate and Rhythm: Normal rate and regular rhythm.     Pulses: Normal pulses.     Heart sounds: Murmur heard.     Comments: 2/6 SEM Pulmonary:     Effort: Pulmonary effort is normal.     Breath sounds: Normal breath sounds.  Abdominal:     General: Bowel sounds are normal.     Palpations: Abdomen is soft.  Musculoskeletal:     Cervical back: Neck supple. No tenderness.     Right lower leg: No edema.     Left lower leg: No edema.  Neurological:     Mental Status: She is alert.  Psychiatric:        Mood and Affect: Mood normal.        Behavior: Behavior normal.     Comments: Pleasant affect      No results found for any visits on 06/12/24.    The ASCVD Risk score (Arnett DK, et al., 2019)  failed to calculate for the following reasons:   The 2019 ASCVD risk score is only valid for ages 92 to 11    Assessment & Plan:  Primary osteoarthritis, unspecified site Assessment & Plan: Stable.  Request and review old records.  Safety proofing the home reviewed.   Primary hypertension Assessment & Plan: Controlled.  Orders: -     Olmesartan  Medoxomil; Take 1 tablet (20 mg total) by mouth daily.  Dispense: 90 tablet; Refill: 1  Memory loss Assessment & Plan: Relatively recent head scan noted.  May improve with B12 injections, but proceed with Neurology evaluation.  Orders: -     TSH -     Ambulatory referral to Neurology  Pernicious anemia -     Cyanocobalamin   Vitamin D  deficiency -     VITAMIN D  25 Hydroxy (Vit-D Deficiency, Fractures)    Return in about 4 weeks (around 07/10/2024) for chronic follow-up.    REDDING PONCE NORLEEN FALCON., MD

## 2024-06-12 NOTE — Assessment & Plan Note (Signed)
 Stable.  Request and review old records.  Safety proofing the home reviewed.

## 2024-06-12 NOTE — Progress Notes (Signed)
 Patient is in office today for a nurse visit for B12 Injection. Patient Injection was given in the  Right deltoid. Patient tolerated injection well.

## 2024-06-12 NOTE — Assessment & Plan Note (Signed)
 Relatively recent head scan noted.  May improve with B12 injections, but proceed with Neurology evaluation.

## 2024-06-12 NOTE — Assessment & Plan Note (Signed)
 Controlled.

## 2024-06-13 ENCOUNTER — Telehealth (HOSPITAL_BASED_OUTPATIENT_CLINIC_OR_DEPARTMENT_OTHER): Payer: Self-pay | Admitting: *Deleted

## 2024-06-13 ENCOUNTER — Ambulatory Visit (HOSPITAL_BASED_OUTPATIENT_CLINIC_OR_DEPARTMENT_OTHER): Payer: Self-pay | Admitting: Family Medicine

## 2024-06-13 LAB — TSH: TSH: 1.86 u[IU]/mL (ref 0.450–4.500)

## 2024-06-13 LAB — VITAMIN D 25 HYDROXY (VIT D DEFICIENCY, FRACTURES): Vit D, 25-Hydroxy: 68.8 ng/mL (ref 30.0–100.0)

## 2024-06-13 NOTE — Telephone Encounter (Signed)
Pt. Was called

## 2024-07-12 ENCOUNTER — Ambulatory Visit (INDEPENDENT_AMBULATORY_CARE_PROVIDER_SITE_OTHER): Admitting: Family Medicine

## 2024-07-12 VITALS — BP 148/79 | HR 58 | Temp 98.1°F | Resp 16 | Wt 141.6 lb

## 2024-07-12 DIAGNOSIS — Z23 Encounter for immunization: Secondary | ICD-10-CM | POA: Diagnosis not present

## 2024-07-12 DIAGNOSIS — R413 Other amnesia: Secondary | ICD-10-CM | POA: Diagnosis not present

## 2024-07-12 DIAGNOSIS — D510 Vitamin B12 deficiency anemia due to intrinsic factor deficiency: Secondary | ICD-10-CM | POA: Diagnosis not present

## 2024-07-12 DIAGNOSIS — M810 Age-related osteoporosis without current pathological fracture: Secondary | ICD-10-CM | POA: Diagnosis not present

## 2024-07-12 DIAGNOSIS — I10 Essential (primary) hypertension: Secondary | ICD-10-CM

## 2024-07-12 MED ORDER — CYANOCOBALAMIN 1000 MCG/ML IJ SOLN
1000.0000 ug | Freq: Once | INTRAMUSCULAR | Status: AC
  Administered 2024-07-12: 1000 ug via INTRAMUSCULAR

## 2024-07-12 NOTE — Progress Notes (Signed)
 Patient is in office today for a nurse visit for B12 Injection. Patient Injection was given in the  Right deltoid. Patient tolerated injection well.

## 2024-07-12 NOTE — Assessment & Plan Note (Signed)
Satisfactory control. 

## 2024-07-12 NOTE — Assessment & Plan Note (Signed)
 Suspected dementia.  Await Neurology input and continue B12 shots.

## 2024-07-12 NOTE — Assessment & Plan Note (Signed)
 She hasn't seen a dentist in years.  I advised a dental checkup, and we can then consider Alendronate.  Extended d/w her daughter as well.

## 2024-07-12 NOTE — Progress Notes (Signed)
 Established Patient Office Visit  Subjective   Patient ID: Brenda Lang, female    DOB: 1936-08-16  Age: 88 y.o. MRN: [id number]  Chief Complaint  Patient presents with   Follow-up    Follow-up    F/u as above.  Memory loss being worked up, with Neurology evaluation pending.  B12 replacement underway.  Overall she is well with few concerns.  It sounds like she had a Dexascan about one year ago with Dr. Gayl and she received a Reclast  infusion.  Unfortunately she didn't tolerate this well.  Extended discussion today.  Reviewed recent labs in detail.    Past Medical History:  Diagnosis Date   Colonoscopy refused    and now of questionable value   History of UTI    Recurring, with Urology encouraged.   Hypertension    Memory loss    Workup underway   Osteoarthritis    Osteoporosis    Intolerant to Reclast    Pernicious anemia     Outpatient Encounter Medications as of 07/12/2024  Medication Sig   Apoaequorin (PREVAGEN PO) Take 1 capsule by mouth daily.   cyanocobalamin  (VITAMIN B12) 1000 MCG/ML injection Inject 1,000 mcg into the muscle once.   olmesartan  (BENICAR ) 20 MG tablet Take 1 tablet (20 mg total) by mouth daily.   [EXPIRED] cyanocobalamin  (VITAMIN B12) injection 1,000 mcg    No facility-administered encounter medications on file as of 07/12/2024.    Social History   Tobacco Use   Smoking status: Never   Smokeless tobacco: Never  Substance Use Topics   Alcohol  use: No   Drug use: Never      Review of Systems  Constitutional:  Negative for diaphoresis, fever, malaise/fatigue and weight loss.  Respiratory:  Negative for cough, shortness of breath and wheezing.   Cardiovascular:  Negative for chest pain, palpitations, orthopnea, claudication, leg swelling and PND.      Objective:     BP (!) 148/79 (BP Location: Right Arm, Cuff Size: Normal)   Pulse (!) 58   Temp 98.1 F (36.7 C) (Oral)   Resp 16   Wt 141 lb 9.6 oz (64.2 kg)   SpO2 97%    BMI 23.88 kg/m    Physical Exam Constitutional:      General: She is not in acute distress.    Appearance: Normal appearance.  HENT:     Head: Normocephalic.  Neck:     Vascular: No carotid bruit.  Cardiovascular:     Rate and Rhythm: Normal rate and regular rhythm.     Pulses: Normal pulses.     Heart sounds: Normal heart sounds.  Pulmonary:     Effort: Pulmonary effort is normal.     Breath sounds: Normal breath sounds.  Abdominal:     General: Bowel sounds are normal.     Palpations: Abdomen is soft.  Musculoskeletal:     Cervical back: Neck supple. No tenderness.     Right lower leg: No edema.     Left lower leg: No edema.  Neurological:     Mental Status: She is alert.      No results found for any visits on 07/12/24.    The ASCVD Risk score (Arnett DK, et al., 2019) failed to calculate for the following reasons:   The 2019 ASCVD risk score is only valid for ages 57 to 22    Assessment & Plan:  Memory loss Assessment & Plan: Suspected dementia.  Await Neurology input and continue B12 shots.  Pernicious anemia -     Cyanocobalamin   Primary hypertension Assessment & Plan: Satisfactory control.   Age-related osteoporosis without current pathological fracture Assessment & Plan: She hasn't seen a dentist in years.  I advised a dental checkup, and we can then consider Alendronate.  Extended d/w her daughter as well.   Encounter for immunization -     Flu vaccine HIGH DOSE PF(Fluzone Trivalent)    Return in about 6 months (around 01/10/2025) for chronic follow-up.    REDDING PONCE NORLEEN FALCON., MD

## 2024-07-13 DIAGNOSIS — I10 Essential (primary) hypertension: Secondary | ICD-10-CM | POA: Diagnosis not present

## 2024-07-13 DIAGNOSIS — S81801A Unspecified open wound, right lower leg, initial encounter: Secondary | ICD-10-CM | POA: Diagnosis not present

## 2024-07-16 ENCOUNTER — Encounter (HOSPITAL_BASED_OUTPATIENT_CLINIC_OR_DEPARTMENT_OTHER): Payer: Self-pay | Admitting: Family Medicine

## 2024-07-23 ENCOUNTER — Ambulatory Visit (HOSPITAL_BASED_OUTPATIENT_CLINIC_OR_DEPARTMENT_OTHER): Payer: Self-pay | Admitting: Student

## 2024-07-23 ENCOUNTER — Encounter (HOSPITAL_BASED_OUTPATIENT_CLINIC_OR_DEPARTMENT_OTHER): Payer: Self-pay | Admitting: Student

## 2024-07-23 ENCOUNTER — Ambulatory Visit: Payer: Self-pay

## 2024-07-23 ENCOUNTER — Ambulatory Visit (INDEPENDENT_AMBULATORY_CARE_PROVIDER_SITE_OTHER): Admitting: Student

## 2024-07-23 ENCOUNTER — Ambulatory Visit (INDEPENDENT_AMBULATORY_CARE_PROVIDER_SITE_OTHER)
Admission: RE | Admit: 2024-07-23 | Discharge: 2024-07-23 | Disposition: A | Discharge status: Home / Self Care | Source: Ambulatory Visit | Attending: Student | Admitting: Student

## 2024-07-23 ENCOUNTER — Other Ambulatory Visit (HOSPITAL_BASED_OUTPATIENT_CLINIC_OR_DEPARTMENT_OTHER): Payer: Self-pay

## 2024-07-23 VITALS — BP 109/71 | HR 99 | Temp 98.0°F | Resp 16 | Ht 64.57 in | Wt 139.2 lb

## 2024-07-23 DIAGNOSIS — I700 Atherosclerosis of aorta: Secondary | ICD-10-CM | POA: Diagnosis not present

## 2024-07-23 DIAGNOSIS — R059 Cough, unspecified: Secondary | ICD-10-CM | POA: Diagnosis not present

## 2024-07-23 DIAGNOSIS — L03115 Cellulitis of right lower limb: Secondary | ICD-10-CM | POA: Diagnosis not present

## 2024-07-23 DIAGNOSIS — R0989 Other specified symptoms and signs involving the circulatory and respiratory systems: Secondary | ICD-10-CM | POA: Diagnosis not present

## 2024-07-23 DIAGNOSIS — J479 Bronchiectasis, uncomplicated: Secondary | ICD-10-CM | POA: Diagnosis not present

## 2024-07-23 LAB — CBC WITH DIFFERENTIAL/PLATELET
Basophils Absolute: 0.1 x10E3/uL (ref 0.0–0.2)
Basos: 1 %
EOS (ABSOLUTE): 0.2 x10E3/uL (ref 0.0–0.4)
Eos: 2 %
Hematocrit: 43.6 % (ref 34.0–46.6)
Hemoglobin: 13.9 g/dL (ref 11.1–15.9)
Immature Grans (Abs): 0 x10E3/uL (ref 0.0–0.1)
Immature Granulocytes: 0 %
Lymphocytes Absolute: 2.5 x10E3/uL (ref 0.7–3.1)
Lymphs: 23 %
MCH: 28.1 pg (ref 26.6–33.0)
MCHC: 31.9 g/dL (ref 31.5–35.7)
MCV: 88 fL (ref 79–97)
Monocytes Absolute: 0.8 x10E3/uL (ref 0.1–0.9)
Monocytes: 8 %
Neutrophils Absolute: 7.1 x10E3/uL — ABNORMAL HIGH (ref 1.4–7.0)
Neutrophils: 66 %
Platelets: 475 x10E3/uL — ABNORMAL HIGH (ref 150–450)
RBC: 4.94 x10E6/uL (ref 3.77–5.28)
RDW: 13.9 % (ref 11.7–15.4)
WBC: 10.8 x10E3/uL (ref 3.4–10.8)

## 2024-07-23 MED ORDER — DOXYCYCLINE HYCLATE 100 MG PO TABS
100.0000 mg | ORAL_TABLET | Freq: Two times a day (BID) | ORAL | 0 refills | Status: AC
  Filled 2024-07-23: qty 14, 7d supply, fill #0

## 2024-07-23 NOTE — Patient Instructions (Signed)
 It was nice to see you today!  As we discussed in clinic:  I would advise you to please watch out for any further redness or swelling as well as any fever, chills, sweats, or shortness of breath.  Please place a small layer of vaseline on the wound daily and the cover up the wound with a bandage. You will ocme back in 2 weeks to make sure that the wound does not worsen.  If you have any problems before your next visit feel free to message me via MyChart (minor issues or questions) or call the office, otherwise you may reach out to schedule an office visit.  Thank you! Tashiana Lamarca, PA-C

## 2024-07-23 NOTE — Telephone Encounter (Signed)
 FYI Only or Action Required?: FYI only for provider.  Patient was last seen in primary care on 07/12/2024 by Dottie Norleen PHEBE PONCE, MD.  Called Nurse Triage reporting Wound Infection. And URI - grey phlegm  Symptoms began several weeks ago. Leg injured 2 weeks ago  Interventions attempted: Other: UC - received tetanus shot. .  Symptoms are: unchanged.  Triage Disposition: See Physician Within 24 Hours  Patient/caregiver understands and will follow disposition?: Yes - appt for this morning in office.                         Copied from CRM [id number]. Topic: Clinical - Red Word Triage >> Jul 23, 2024  7:40 AM Charlet HERO wrote: Red Word that prompted transfer to Nurse Triage: Patient is calling about a sore on her leg she is stating that it looks red and swollen and it hurts her. She also has a bad cold. [place] Reason for Disposition  [1] Looks infected (e.g., spreading redness, pus) AND [2] no fever  Answer Assessment - Initial Assessment Questions Pt states in addition to this leg wound she has a cold. She is bringing up grey phlegm.     1. APPEARANCE of INJURY: What does the injury look like?      Red and swollen 2. ONSET: How long ago did the injury occur?      2 weeks 3. LOCATION: Where is the injury located?      Right leg above ankle on side and front 4. SIZE: How large is the cut?      Half the size of her palm 5. BLEEDING: Is it bleeding now? If Yes, ask: Is it difficult to stop?      no 6. PAIN: Is there any pain? If Yes, ask: How bad is the pain? (Scale 0-10; or none, mild, moderate, severe)     No pain. 7. MECHANISM: Tell me how it happened.      Ply wood fell on her leg 8. TETANUS: When was your last tetanus booster?     2 weeks  Protocols used: Cuts and Lacerations-A-AH

## 2024-07-23 NOTE — Telephone Encounter (Signed)
 Jacob saw this pt today.

## 2024-07-23 NOTE — Progress Notes (Addendum)
 Acute Office Visit  Subjective:     Patient ID: Brenda Lang, female    DOB: [birth year], 88 y.o.   MRN: [id number]  Chief Complaint  Patient presents with   Wound Check    Hurt leg with a piece of plywood lon 07/13/2024 and went to [facility] urgent care. See attached picture of mychart message dated 07/16/2024.  No fevers. Feels warm now. It was throbbing this morning so felt like it was getting worse. Is concerned of losing her leg. Using neosporin. Has never had  TDAP that she knows of.    Discussed the use of AI scribe software for clinical note transcription with the patient, who gave verbal consent to proceed.  History of Present Illness   Brenda Lang is an 88 year old female who presents with a skin tear on her right leg.  Approximately ten days ago, she sustained a skin tear on her right leg after a piece of plywood slid off a wheelbarrow and hit her leg. The injury resulted in a tear of the skin, which has been leaking serosanguineous fluid. The area is sore and red. No streaks are noted going up her leg. She has not been using stockings on the wound to allow it to breathe. She visited urgent care where she received a tetanus shot, but no other treatment was provided. The wound continues to leak fluid, described as 'orangey looking'- no pustular contents.  She has had a cold for the past two to three days, characterized by nasal congestion and coughing up thick mucus. No fever, chills, shortness of breath, or chest pain. She has been experiencing headaches and has not taken any medication for the cold yet due to concerns about potential stomach bleeds with ibuprofen.         ROS Per HPI     Objective:    BP 109/71   Pulse 99   Temp 98 F (36.7 C) (Oral)   Resp 16   Ht 5' 4.57 (1.64 m)   Wt 139 lb 3.2 oz (63.1 kg)   SpO2 97%   BMI 23.47 kg/m  BP Readings from Last 3 Encounters:  07/23/24 109/71  07/12/24 (!) 148/79  06/12/24 (!) 146/83   Wt Readings from  Last 3 Encounters:  07/23/24 139 lb 3.2 oz (63.1 kg)  07/12/24 141 lb 9.6 oz (64.2 kg)  06/12/24 139 lb 6.4 oz (63.2 kg)      Physical Exam Vitals reviewed.  Constitutional:      General: She is not in acute distress.    Appearance: Normal appearance. She is not ill-appearing.  HENT:     Head: Normocephalic and atraumatic.     Nose: Nose normal.  Eyes:     General: No scleral icterus.    Conjunctiva/sclera: Conjunctivae normal.  Cardiovascular:     Rate and Rhythm: Normal rate and regular rhythm.     Heart sounds: Normal heart sounds. No murmur heard.    No friction rub.  Pulmonary:     Effort: Pulmonary effort is normal. No respiratory distress.     Breath sounds: Rales present. No wheezing or rhonchi.  Musculoskeletal:        General: Normal range of motion.  Skin:    General: Skin is warm and dry.     Coloration: Skin is not jaundiced or pale.     Findings: Lesion present.     Comments: Around a 6 cm C- shaped wound 2/2 skin tear. Overlying tissue is  non- necrotic or macerated. Wound is dry. Presence of serosanguinous fluid under wound. No pustular contents noted. Surrounding skin appears to be erythematous.  Neurological:     General: No focal deficit present.     Mental Status: She is alert.  Psychiatric:        Mood and Affect: Mood normal.        Behavior: Behavior normal.     No results found for any visits on 07/23/24.      Assessment & Plan:    Assessment and Plan    Skin tear with possible cellulitis, right lower leg Skin tear on the right lower leg with surrounding erythema, possibly indicating cellulitis. The wound is leaking serosanguineous fluid- no concern with this but I do have concern with surrounding erythema- higher concern with elevated age. Wound duration is approximately ten days. - Prescribe antibiotics to cover potential infection- rales noted on exam. Will provide doxy to cover any underlying lung infection as well as soft tissue. -  Advise cleansing the wound with soap and water. - Recommend applying a thin layer of petroleum jelly to maintain moisture. - Instruct to cover the wound with gauze - Draw a line around the erythema to monitor for spread. - Schedule follow-up in two weeks with Dr. Dottie. - Advise to return if the wound opens or signs of infection develop.  Cough with abnormal lung sounds, rule out pneumonia Cough with thick sputum production and abnormal lung sounds, raising concern for pneumonia. No fever, chills, dyspnea, or chest pain. Symptoms present for two to three days. - Order chest x-ray to rule out pneumonia. - Advise monitoring for fever or worsening symptoms. - Recommend acetaminophen  for throat and leg pain. - Instruct to report any fever to the clinic.     Return in about 2 weeks (around 08/06/2024) for skin tear followup with Dr. Dottie.  [name] T Naome Brigandi, PA-C

## 2024-07-27 ENCOUNTER — Ambulatory Visit (HOSPITAL_BASED_OUTPATIENT_CLINIC_OR_DEPARTMENT_OTHER): Admitting: Family Medicine

## 2024-07-30 ENCOUNTER — Ambulatory Visit: Admitting: Cardiology

## 2024-08-06 ENCOUNTER — Ambulatory Visit (HOSPITAL_BASED_OUTPATIENT_CLINIC_OR_DEPARTMENT_OTHER): Admitting: Family Medicine

## 2024-08-07 ENCOUNTER — Other Ambulatory Visit (HOSPITAL_BASED_OUTPATIENT_CLINIC_OR_DEPARTMENT_OTHER): Payer: Self-pay

## 2024-08-07 ENCOUNTER — Encounter (HOSPITAL_BASED_OUTPATIENT_CLINIC_OR_DEPARTMENT_OTHER): Payer: Self-pay | Admitting: Family Medicine

## 2024-08-07 ENCOUNTER — Ambulatory Visit (INDEPENDENT_AMBULATORY_CARE_PROVIDER_SITE_OTHER): Admitting: Family Medicine

## 2024-08-07 VITALS — BP 128/81 | Temp 97.2°F | Resp 16 | Wt 140.5 lb

## 2024-08-07 DIAGNOSIS — S81801S Unspecified open wound, right lower leg, sequela: Secondary | ICD-10-CM | POA: Insufficient documentation

## 2024-08-07 DIAGNOSIS — R413 Other amnesia: Secondary | ICD-10-CM | POA: Diagnosis not present

## 2024-08-07 DIAGNOSIS — I10 Essential (primary) hypertension: Secondary | ICD-10-CM | POA: Diagnosis not present

## 2024-08-07 DIAGNOSIS — M810 Age-related osteoporosis without current pathological fracture: Secondary | ICD-10-CM

## 2024-08-07 DIAGNOSIS — D510 Vitamin B12 deficiency anemia due to intrinsic factor deficiency: Secondary | ICD-10-CM | POA: Diagnosis not present

## 2024-08-07 MED ORDER — CYANOCOBALAMIN 1000 MCG/ML IJ SOLN
1000.0000 ug | Freq: Once | INTRAMUSCULAR | Status: AC
  Administered 2024-08-07: 1000 ug via INTRAMUSCULAR

## 2024-08-07 NOTE — Assessment & Plan Note (Signed)
 Hygiene reviewed.  Keep open to air at home.  F/u prn.

## 2024-08-07 NOTE — Assessment & Plan Note (Signed)
Satisfactory control. 

## 2024-08-07 NOTE — Assessment & Plan Note (Signed)
 Await Neurology input.

## 2024-08-07 NOTE — Progress Notes (Signed)
 Established Patient Office Visit  Subjective   Patient ID: Brenda Lang, female    DOB: 09-29-1935  Age: 88 y.o. MRN: [id number]  Chief Complaint  Patient presents with   Follow-up    Follow-up    F/u as above.  Please see previous notes for details.  She has ongoing concerns about her right leg wound as described in previous notes.  Im pleased to hear that the wound is much improved per the family's report.  No purulent drainage, fevers, etc.  Recent tetanus booster reported.  Upcoming appt with Neurology for dementia workup.  Upcoming appt with Dentistry to discuss the option of Alendronate.    Past Medical History:  Diagnosis Date   Colonoscopy refused    and now of questionable value   History of UTI    Recurring, with Urology encouraged.   Hypertension    Memory loss    Neurology consult pending   Osteoarthritis    Osteoporosis    Intolerant to Reclast    Pernicious anemia     Outpatient Encounter Medications as of 08/07/2024  Medication Sig   Apoaequorin (PREVAGEN PO) Take 1 capsule by mouth daily.   olmesartan  (BENICAR ) 20 MG tablet Take 1 tablet (20 mg total) by mouth daily.   [EXPIRED] cyanocobalamin  (VITAMIN B12) injection 1,000 mcg    No facility-administered encounter medications on file as of 08/07/2024.    Social History   Tobacco Use   Smoking status: Never    Passive exposure: Never   Smokeless tobacco: Never  Vaping Use   Vaping status: Never Used  Substance Use Topics   Alcohol  use: No   Drug use: Never      Review of Systems  Constitutional:  Negative for fever and malaise/fatigue.  HENT:  Negative for hearing loss.   Cardiovascular:  Negative for chest pain.  Gastrointestinal:  Negative for abdominal pain.  Skin:  Negative for rash.  Neurological:  Negative for dizziness, tingling, tremors, sensory change, focal weakness, seizures, loss of consciousness and headaches.  Psychiatric/Behavioral:  Positive for memory loss. Negative  for depression and hallucinations. The patient does not have insomnia.       Objective:     BP 128/81 (Cuff Size: Normal)   Temp (!) 97.2 F (36.2 C) (Oral)   Resp 16   Wt 140 lb 8 oz (63.7 kg)   SpO2 96%   BMI 23.69 kg/m    Physical Exam Constitutional:      General: She is not in acute distress.    Appearance: Normal appearance.  HENT:     Head: Normocephalic.  Neck:     Vascular: No carotid bruit.  Cardiovascular:     Rate and Rhythm: Normal rate and regular rhythm.     Pulses: Normal pulses.     Heart sounds: Normal heart sounds.  Pulmonary:     Effort: Pulmonary effort is normal.     Breath sounds: Normal breath sounds.  Abdominal:     General: Bowel sounds are normal.     Palpations: Abdomen is soft.  Musculoskeletal:     Cervical back: Neck supple. No tenderness.     Right lower leg: No edema.     Left lower leg: No edema.  Skin:    Comments: Right leg wound looks clean with healing margins.  No current evidence of infection.  Neurological:     Mental Status: She is alert.      No results found for any visits on 08/07/24.  The ASCVD Risk score (Arnett DK, et al., 2019) failed to calculate for the following reasons:   The 2019 ASCVD risk score is only valid for ages 28 to 35    Assessment & Plan:  Memory loss Assessment & Plan: Await Neurology input.   Age-related osteoporosis without current pathological fracture Assessment & Plan: Await dental evaluation as above.  Hopefully start bisphosponate fairly soon.  Safety proofing and fall precautions again reviewed.   Primary hypertension Assessment & Plan: Satisfactory control.   Pernicious anemia -     Cyanocobalamin   Leg wound, right, sequela Assessment & Plan: Hygiene reviewed.  Keep open to air at home.  F/u prn.     Return in about 3 months (around 11/07/2024) for chronic follow-up.    REDDING PONCE NORLEEN FALCON., MD

## 2024-08-07 NOTE — Progress Notes (Signed)
 Patient is in office today for a nurse visit for B12 Injection. Patient Injection was given in the  Right deltoid. Patient tolerated injection well.

## 2024-08-07 NOTE — Assessment & Plan Note (Addendum)
 Await dental evaluation as above.  Hopefully start bisphosponate fairly soon.  Safety proofing and fall precautions again reviewed.

## 2024-08-21 ENCOUNTER — Other Ambulatory Visit (HOSPITAL_BASED_OUTPATIENT_CLINIC_OR_DEPARTMENT_OTHER): Payer: Self-pay

## 2024-08-27 ENCOUNTER — Ambulatory Visit: Payer: Self-pay

## 2024-08-27 ENCOUNTER — Telehealth (HOSPITAL_BASED_OUTPATIENT_CLINIC_OR_DEPARTMENT_OTHER): Payer: Self-pay

## 2024-08-27 ENCOUNTER — Other Ambulatory Visit (HOSPITAL_BASED_OUTPATIENT_CLINIC_OR_DEPARTMENT_OTHER): Payer: Self-pay

## 2024-08-27 NOTE — Telephone Encounter (Signed)
 error

## 2024-08-27 NOTE — Telephone Encounter (Signed)
 FYI Only or Action Required?: FYI only for provider: appointment scheduled on 08/28/24.  Patient was last seen in primary care on 08/07/2024 by Dottie Norleen PHEBE PONCE, MD.  Called Nurse Triage reporting Open Wound.  Symptoms began several weeks ago.  Interventions attempted: Nothing.  Symptoms are: stable.  Triage Disposition: See PCP When Office is Open (Within 3 Days)  Patient/caregiver understands and will follow disposition?: Yes   Reason for Disposition  [1] After 14 days AND [2] wound isn't healed  Answer Assessment - Initial Assessment Questions Patient's daughter called and says she didn't realize her mom called. She says the wound looks better and smaller than it was 6 weeks ago. She says she doesn't need an appointment, but she will keep it for her mom's peace of mind. She asked if she could get the B12 shot while she's here tomorrow. Added to the appointment notes and advised to cancel the B12 appointment if she's able to get it tomorrow, she verbalized understanding.   1. DESCRIPTION: What does the injury look like?  Is there any part of the skin missing?  If yes, How much of the skin flap is missing?  (25%, 50%, 75%, all)     Looks better compared to a month ago, smaller, dried up  2. SIZE: How large is the skin tear?     Size of 2 quarters side by side   3. BLEEDING: Is it bleeding now? If Yes, ask: Is it difficult to stop?     No  4. LOCATION: Where is the skin tear located?     Left lower leg on outside halfway down calf  5. ONSET: How long ago did the skin tear occur?     6 weeks ago  6. MECHANISM: Tell me how it happened.     A board fell on it  Protocols used: Skin Tear-A-AH

## 2024-08-27 NOTE — Telephone Encounter (Signed)
 First attempt to contact for triage.  LVM with patient's daughter for return call to [phone]. Placed in callbacks for follow up Also LVM on patient's phone as well   Copied from CRM #[id number]. Topic: Clinical - Red Word Triage >> Aug 27, 2024 11:00 AM Aleatha BROCKS wrote: Red Word that prompted transfer to Nurse Triage: patient had a tear on leg, and Dr redding told her how to take care of it but it is still red and wants to know what she should do

## 2024-08-28 ENCOUNTER — Ambulatory Visit (INDEPENDENT_AMBULATORY_CARE_PROVIDER_SITE_OTHER): Admitting: Family Medicine

## 2024-08-28 VITALS — BP 146/83 | HR 63 | Temp 97.9°F | Resp 16 | Wt 140.7 lb

## 2024-08-28 DIAGNOSIS — R413 Other amnesia: Secondary | ICD-10-CM | POA: Diagnosis not present

## 2024-08-28 DIAGNOSIS — D510 Vitamin B12 deficiency anemia due to intrinsic factor deficiency: Secondary | ICD-10-CM | POA: Diagnosis not present

## 2024-08-28 DIAGNOSIS — M810 Age-related osteoporosis without current pathological fracture: Secondary | ICD-10-CM

## 2024-08-28 DIAGNOSIS — S81801S Unspecified open wound, right lower leg, sequela: Secondary | ICD-10-CM | POA: Diagnosis not present

## 2024-08-28 MED ORDER — CYANOCOBALAMIN 1000 MCG/ML IJ SOLN
1000.0000 ug | Freq: Once | INTRAMUSCULAR | Status: AC
  Administered 2024-08-28: 1000 ug via INTRAMUSCULAR

## 2024-08-28 NOTE — Assessment & Plan Note (Signed)
Reassured

## 2024-08-28 NOTE — Progress Notes (Signed)
 Established Patient Office Visit  Subjective   Patient ID: Brenda Lang, female    DOB: 1936-02-06  Age: 88 y.o. MRN: [id number]  Chief Complaint  Patient presents with   Follow-up    Follow-up on wound    F/u as above.  Please see last note for details.  I'm pleased to hear that her leg wound has been healing nicely.  Upcoming appt with Neurology for her dementia workup.  Her B12 shots have been helping.  Her dentist is reportedly a bit undecided about bisphosphonate therapy for her.    Discussed the use of AI scribe software for clinical note transcription with the patient, who gave verbal consent to proceed.  History of Present Illness     Past Medical History:  Diagnosis Date   Colonoscopy refused    and now of questionable value   History of UTI    Recurring, with Urology encouraged.   Hypertension    Memory loss    Neurology consult pending   Osteoarthritis    Osteoporosis    Intolerant to Reclast    Pernicious anemia     Outpatient Encounter Medications as of 08/28/2024  Medication Sig   Apoaequorin (PREVAGEN PO) Take 1 capsule by mouth daily.   olmesartan  (BENICAR ) 20 MG tablet Take 1 tablet (20 mg total) by mouth daily.   [EXPIRED] cyanocobalamin  (VITAMIN B12) injection 1,000 mcg    No facility-administered encounter medications on file as of 08/28/2024.    Social History   Tobacco Use   Smoking status: Never    Passive exposure: Never   Smokeless tobacco: Never  Vaping Use   Vaping status: Never Used  Substance Use Topics   Alcohol  use: No   Drug use: Never      ROS    Objective:     BP (!) 146/83 (Cuff Size: Normal)   Pulse 63   Temp 97.9 F (36.6 C) (Oral)   Resp 16   Wt 140 lb 11.2 oz (63.8 kg)   SpO2 96%   BMI 23.73 kg/m    Physical Exam Constitutional:      General: She is not in acute distress.    Appearance: Normal appearance.  HENT:     Head: Normocephalic.  Neck:     Vascular: No carotid bruit.   Cardiovascular:     Rate and Rhythm: Normal rate and regular rhythm.     Pulses: Normal pulses.     Heart sounds: Normal heart sounds.  Pulmonary:     Effort: Pulmonary effort is normal.     Breath sounds: Normal breath sounds.  Abdominal:     General: Bowel sounds are normal.     Palpations: Abdomen is soft.  Musculoskeletal:     Cervical back: Neck supple. No tenderness.     Right lower leg: No edema.     Left lower leg: No edema.  Skin:    Comments: Leg wound healing nicely.  Neurological:     Mental Status: She is alert.      No results found for any visits on 08/28/24.    The ASCVD Risk score (Arnett DK, et al., 2019) failed to calculate for the following reasons:   The 2019 ASCVD risk score is only valid for ages 30 to 37    Assessment & Plan:  Memory loss Assessment & Plan: F/u with Neurology as directed.   Pernicious anemia -     Cyanocobalamin   Age-related osteoporosis without current pathological fracture Assessment & Plan:  Daughter and I will revisit possible Alendronate in a few months after we have more information from dentist.   Leg wound, right, sequela Assessment & Plan: Reassured.     Assessment and Plan Assessment & Plan      No follow-ups on file.    REDDING PONCE NORLEEN FALCON., MD

## 2024-08-28 NOTE — Progress Notes (Signed)
 Patient is in office today for a nurse visit for B12 Injection. Patient Injection was given in the  Right deltoid. Patient tolerated injection well.

## 2024-08-28 NOTE — Assessment & Plan Note (Signed)
 Daughter and I will revisit possible Alendronate in a few months after we have more information from dentist.

## 2024-08-28 NOTE — Assessment & Plan Note (Signed)
 F/u with Neurology as directed.

## 2024-09-06 ENCOUNTER — Ambulatory Visit (HOSPITAL_BASED_OUTPATIENT_CLINIC_OR_DEPARTMENT_OTHER)

## 2024-09-18 ENCOUNTER — Ambulatory Visit: Admitting: Neurology

## 2024-09-18 ENCOUNTER — Encounter: Payer: Self-pay | Admitting: Neurology

## 2024-09-18 VITALS — BP 138/78 | HR 72 | Ht 62.0 in | Wt 140.0 lb

## 2024-09-18 DIAGNOSIS — G3184 Mild cognitive impairment, so stated: Secondary | ICD-10-CM

## 2024-09-18 NOTE — Progress Notes (Signed)
 "   [facility]  PATIENT: Brenda Lang DOB: 03/20/1936  REQUESTING CLINICIAN: Dottie Norleen PHEBE PONCE, MD HISTORY FROM: Patient and husband and daughter  REASON FOR VISIT: Memory loss    HISTORICAL  CHIEF COMPLAINT:  Chief Complaint  Patient presents with   New Patient (Initial Visit)    Room 13  With Daughter & Husband Moca score 14 NP internal referral for Memory loss    HISTORY OF PRESENT ILLNESS:  This is a 88 year old woman with history of hypertension who is presenting with daughter and husband with complaint of memory loss.  Patient feels like her memory is fine, she knows that it is what it used to be but she tells me that she is independent in all of ADLs.  Daughter reports that patient repeats herself, she sometimes get confused about the date and time.  She did forget her grandson birthday.  She lives with her husband and again independent in ADLs.    TBI:   No past history of TBI Stroke:   no past history of stroke Seizures:   no past history of seizures Sleep:   no history of sleep apnea.   Mood:   patient denies anxiety and depression Family history of Dementia: Mother with dementia late 67s  Functional status: independent in all ADLs and IADLs Patient lives with husband. Cooking: no issues Cleaning: no issues Shopping: no issues  Bathing: no issues  Toileting: no issues  Driving: no issues  Bills: no issues Medications: no issues  Ever left the stove on by accident?: denies Forget how to use items around the house?: denies Getting lost going to familiar places?: denies Forgetting loved ones names?: denies Word finding difficulty? Sometimes  Sleep: good    OTHER MEDICAL CONDITIONS: Hypertension    REVIEW OF SYSTEMS: Full 14 system review of systems performed and negative with exception of: As noted in the HPI  ALLERGIES: Allergies[1]  HOME MEDICATIONS: Outpatient Medications Prior to Visit  Medication Sig Dispense Refill    olmesartan  (BENICAR ) 20 MG tablet Take 1 tablet (20 mg total) by mouth daily. 90 tablet 1   Apoaequorin (PREVAGEN PO) Take 1 capsule by mouth daily. (Patient not taking: Reported on 09/18/2024)     No facility-administered medications prior to visit.    PAST MEDICAL HISTORY: Past Medical History:  Diagnosis Date   Colonoscopy refused    and now of questionable value   History of UTI    Recurring, with Urology encouraged.   Hypertension    Memory loss    Neurology consult pending   Osteoarthritis    Osteoporosis    Intolerant to Reclast    Pernicious anemia     PAST SURGICAL HISTORY: Past Surgical History:  Procedure Laterality Date   ABDOMINAL HYSTERECTOMY N/A    for noncancerous reasons   EYE SURGERY     TOTAL KNEE ARTHROPLASTY Left 03/22/2016   Procedure: LEFT TOTAL KNEE ARTHROPLASTY;  Surgeon: Marcey Raman, MD;  Location: [facility] OR;  Service: Orthopedics;  Laterality: Left;    FAMILY HISTORY: History reviewed. No pertinent family history.  SOCIAL HISTORY: Social History   Socioeconomic History   Marital status: Married    Spouse name: Not on file   Number of children: 3   Years of education: Not on file   Highest education level: GED or equivalent  Occupational History   Occupation: Retired producer, television/film/video  Tobacco Use   Smoking status: Never    Passive exposure: Never  Smokeless tobacco: Never  Vaping Use   Vaping status: Never Used  Substance and Sexual Activity   Alcohol  use: No   Drug use: Never   Sexual activity: Not on file  Other Topics Concern   Not on file  Social History Narrative   Not on file   Social Drivers of Health   Tobacco Use: Low Risk (09/18/2024)   Patient History    Smoking Tobacco Use: Never    Smokeless Tobacco Use: Never    Passive Exposure: Never  Financial Resource Strain: Low Risk (08/28/2024)   Overall Financial Resource Strain (CARDIA)    Difficulty of Paying Living Expenses: Not hard at all  Food Insecurity: No Food  Insecurity (08/28/2024)   Epic    Worried About Radiation Protection Practitioner of Food in the Last Year: Never true    Ran Out of Food in the Last Year: Never true  Transportation Needs: No Transportation Needs (08/28/2024)   Epic    Lack of Transportation (Medical): No    Lack of Transportation (Non-Medical): No  Physical Activity: Insufficiently Active (08/28/2024)   Exercise Vital Sign    Days of Exercise per Week: 4 days    Minutes of Exercise per Session: 30 min  Stress: No Stress Concern Present (08/28/2024)   [employer] of Occupational Health - Occupational Stress Questionnaire    Feeling of Stress: Only a little  Social Connections: Socially Integrated (08/28/2024)   Social Connection and Isolation Panel    Frequency of Communication with Friends and Family: More than three times a week    Frequency of Social Gatherings with Friends and Family: More than three times a week    Attends Religious Services: More than 4 times per year    Active Member of [employer] or Organizations: Yes    Attends Banker Meetings: More than 4 times per year    Marital Status: Married  Catering Manager Violence: Not At Risk (05/13/2024)   Epic    Fear of Current or Ex-Partner: No    Emotionally Abused: No    Physically Abused: No    Sexually Abused: No  Depression (PHQ2-9): Low Risk (06/12/2024)   Depression (PHQ2-9)    PHQ-2 Score: 4  Alcohol  Screen: Not on file  Housing: Unknown (08/28/2024)   Epic    Unable to Pay for Housing in the Last Year: No    Number of Times Moved in the Last Year: Not on file    Homeless in the Last Year: No  Utilities: Not At Risk (05/13/2024)   Epic    Threatened with loss of utilities: No  Health Literacy: Not on file     PHYSICAL EXAM  GENERAL EXAM/CONSTITUTIONAL: Vitals:  Vitals:   09/18/24 1035 09/18/24 1048  BP: (!) 178/90 138/78  Pulse: 63 72  SpO2: 98%   Weight: 140 lb (63.5 kg)   Height: 5' 2 (1.575 m)    Body mass index is 25.61 kg/m. Wt  Readings from Last 3 Encounters:  09/18/24 140 lb (63.5 kg)  08/28/24 140 lb 11.2 oz (63.8 kg)  08/07/24 140 lb 8 oz (63.7 kg)   Patient is in no distress; well developed, nourished and groomed; neck is supple  MUSCULOSKELETAL: Gait, strength, tone, movements noted in Neurologic exam below  NEUROLOGIC: MENTAL STATUS:      No data to display            09/18/2024   10:40 AM  Montreal Cognitive Assessment   Visuospatial/ Executive (0/5) 2  Naming (0/3) 3  Attention: Read list of digits (0/2) 2  Attention: Read list of letters (0/1) 1  Attention: Serial 7 subtraction starting at 100 (0/3) 0  Language: Repeat phrase (0/2) 2  Language : Fluency (0/1) 1  Abstraction (0/2) 0  Delayed Recall (0/5) 1  Orientation (0/6) 2  Total 14  Adjusted Score (based on education) 14   awake, alert, oriented to person, place and time recent and remote memory intact normal attention and concentration language fluent, comprehension intact, naming intact fund of knowledge appropriate  CRANIAL NERVE:  2nd, 3rd, 4th, 6th- visual fields full to confrontation, extraocular muscles intact, no nystagmus 5th - facial sensation symmetric 7th - facial strength symmetric 8th - hearing intact 9th - palate elevates symmetrically, uvula midline 11th - shoulder shrug symmetric 12th - tongue protrusion midline  MOTOR:  normal bulk and tone, full strength in the BUE, BLE  SENSORY:  normal and symmetric to light touch  COORDINATION:  finger-nose-finger, fine finger movements normal  GAIT/STATION:  normal    DIAGNOSTIC DATA (LABS, IMAGING, TESTING) - I reviewed patient records, labs, notes, testing and imaging myself where available.  Lab Results  Component Value Date   WBC 10.8 07/23/2024   HGB 13.9 07/23/2024   HCT 43.6 07/23/2024   MCV 88 07/23/2024   PLT 475 (H) 07/23/2024      Component Value Date/Time   NA 140 05/14/2024 0208   K 4.1 05/14/2024 0208   CL 110 05/14/2024 0208    CO2 25 05/14/2024 0208   GLUCOSE 94 05/14/2024 0208   BUN 8 05/14/2024 0208   CREATININE 0.95 05/14/2024 0208   CALCIUM 8.3 (L) 05/14/2024 0208   PROT 5.4 (L) 05/14/2024 0208   ALBUMIN 2.8 (L) 05/14/2024 0208   AST 14 (L) 05/14/2024 0208   ALT 9 05/14/2024 0208   ALKPHOS 45 05/14/2024 0208   BILITOT 0.6 05/14/2024 0208   GFRNONAA 59 (L) 05/14/2024 0208   GFRAA 55 (L) 03/23/2016 0517   No results found for: CHOL, HDL, LDLCALC, LDLDIRECT, TRIG, CHOLHDL No results found for: YHAJ8R Lab Results  Component Value Date   VITAMINB12 629 09/18/2024   Lab Results  Component Value Date   TSH 1.860 06/12/2024    CT Head 05/13/2024 1. No acute intracranial abnormality. 2. Prominence of the sulci and ventricles compatible with brain atrophy. 3. Hypoattenuating foci in the cerebral white matter, most likely representing chronic small vessel disease.   ASSESSMENT AND PLAN  88 y.o. year old female with history of hypertension who is presenting with her husband and daughter with complaints of memory loss described as forgetful, repetitive, asking the same questions.  She is independent in all ADLs but her MoCA test score was 14.  Patient likely has mild cognitive impairment.  Will obtain ATN profile to look for Alzheimer disease biomarker.  If present we will likely start patient on acetylcholinesterase inhibitor.  This was discussed with patient and family and they are comfortable with plans.   1. Mild cognitive impairment     Patient Instructions  Will check dementia labs including ATN and B12  I will contact you to go over the results  Continue your other medications  Continue to follow up with PCP  Return if worse   There are well-accepted and sensible ways to reduce risk for Alzheimers disease and other degenerative brain disorders .  Exercise Daily Walk A daily 20 minute walk should be part of your routine. Disease related apathy can be a  significant roadblock to  exercise and the only way to overcome this is to make it a daily routine and perhaps have a reward at the end (something your loved one loves to eat or drink perhaps) or a personal trainer coming to the home can also be very useful. Most importantly, the patient is much more likely to exercise if the caregiver / spouse does it with him/her. In general a structured, repetitive schedule is best.  General Health: Any diseases which effect your body will effect your brain such as a pneumonia, urinary infection, blood clot, heart attack or stroke. Keep contact with your primary care doctor for regular follow ups.  Sleep. A good nights sleep is healthy for the brain. Seven hours is recommended. If you have insomnia or poor sleep habits we can give you some instructions. If you have sleep apnea wear your mask.  Diet: Eating a heart healthy diet is also a good idea; fish and poultry instead of red meat, nuts (mostly non-peanuts), vegetables, fruits, olive oil or canola oil (instead of butter), minimal salt (use other spices to flavor foods), whole grain rice, bread, cereal and pasta and wine in moderation.Research is now showing that the MIND diet, which is a combination of The Mediterranean diet and the DASH diet, is beneficial for cognitive processing and longevity. Information about this diet can be found in The MIND Diet, a book by Annitta Feeling, MS, RDN, and online at wildwildscience.es  Finances, Power of [address] and Advance Directives: You should consider putting legal safeguards in place with regard to financial and medical decision making. While the spouse always has power of attorney for medical and financial issues in the absence of any form, you should consider what you want in case the spouse / caregiver is no longer around or capable of making decisions.     Orders Placed This Encounter  Procedures   Vitamin B12   ATN PROFILE    No orders of the defined types were  placed in this encounter.   Return if symptoms worsen or fail to improve.   Pastor Falling, MD 09/19/2024, 11:07 AM  [facility] [address], [address] [place], [place] [address] [phone]     [1]  Allergies Allergen Reactions   Reclast  [Zoledronic  Acid]     Intolerant   Bactrim Rash   "

## 2024-09-19 NOTE — Patient Instructions (Addendum)
 Will check dementia labs including ATN and B12  I will contact you to go over the results  Continue your other medications  Continue to follow up with PCP  Return if worse   There are well-accepted and sensible ways to reduce risk for Alzheimers disease and other degenerative brain disorders .  Exercise Daily Walk A daily 20 minute walk should be part of your routine. Disease related apathy can be a significant roadblock to exercise and the only way to overcome this is to make it a daily routine and perhaps have a reward at the end (something your loved one loves to eat or drink perhaps) or a personal trainer coming to the home can also be very useful. Most importantly, the patient is much more likely to exercise if the caregiver / spouse does it with him/her. In general a structured, repetitive schedule is best.  General Health: Any diseases which effect your body will effect your brain such as a pneumonia, urinary infection, blood clot, heart attack or stroke. Keep contact with your primary care doctor for regular follow ups.  Sleep. A good nights sleep is healthy for the brain. Seven hours is recommended. If you have insomnia or poor sleep habits we can give you some instructions. If you have sleep apnea wear your mask.  Diet: Eating a heart healthy diet is also a good idea; fish and poultry instead of red meat, nuts (mostly non-peanuts), vegetables, fruits, olive oil or canola oil (instead of butter), minimal salt (use other spices to flavor foods), whole grain rice, bread, cereal and pasta and wine in moderation.Research is now showing that the MIND diet, which is a combination of The Mediterranean diet and the DASH diet, is beneficial for cognitive processing and longevity. Information about this diet can be found in The MIND Diet, a book by Annitta Feeling, MS, RDN, and online at wildwildscience.es  Finances, Power of [address] and Advance Directives: You should consider  putting legal safeguards in place with regard to financial and medical decision making. While the spouse always has power of attorney for medical and financial issues in the absence of any form, you should consider what you want in case the spouse / caregiver is no longer around or capable of making decisions.

## 2024-09-23 ENCOUNTER — Ambulatory Visit: Payer: Self-pay | Admitting: Neurology

## 2024-09-23 LAB — ATN PROFILE
A -- Beta-amyloid 42/40 Ratio: 0.108
Beta-amyloid 40: 224.3 pg/mL
Beta-amyloid 42: 24.28 pg/mL
N -- NfL, Plasma: 5.94 pg/mL (ref 0.00–9.13)
T -- p-tau181: 2.44 pg/mL — AB (ref 0.00–0.97)

## 2024-09-23 LAB — VITAMIN B12: Vitamin B-12: 629 pg/mL (ref 232–1245)

## 2024-10-18 ENCOUNTER — Encounter (HOSPITAL_BASED_OUTPATIENT_CLINIC_OR_DEPARTMENT_OTHER): Payer: Self-pay | Admitting: Family Medicine

## 2024-10-18 ENCOUNTER — Other Ambulatory Visit (HOSPITAL_BASED_OUTPATIENT_CLINIC_OR_DEPARTMENT_OTHER): Payer: Self-pay

## 2024-10-18 ENCOUNTER — Ambulatory Visit (INDEPENDENT_AMBULATORY_CARE_PROVIDER_SITE_OTHER): Admitting: Family Medicine

## 2024-10-18 VITALS — BP 137/90 | HR 87 | Temp 97.4°F | Resp 16 | Wt 140.4 lb

## 2024-10-18 DIAGNOSIS — M810 Age-related osteoporosis without current pathological fracture: Secondary | ICD-10-CM | POA: Diagnosis not present

## 2024-10-18 DIAGNOSIS — G3184 Mild cognitive impairment, so stated: Secondary | ICD-10-CM | POA: Diagnosis not present

## 2024-10-18 DIAGNOSIS — M545 Low back pain, unspecified: Secondary | ICD-10-CM | POA: Diagnosis not present

## 2024-10-18 DIAGNOSIS — D510 Vitamin B12 deficiency anemia due to intrinsic factor deficiency: Secondary | ICD-10-CM

## 2024-10-18 MED ORDER — DONEPEZIL HCL 5 MG PO TABS
5.0000 mg | ORAL_TABLET | Freq: Every day | ORAL | 5 refills | Status: AC
  Filled 2024-10-18: qty 30, 30d supply, fill #0

## 2024-10-18 MED ORDER — CYANOCOBALAMIN 1000 MCG/ML IJ SOLN
1000.0000 ug | Freq: Once | INTRAMUSCULAR | Status: AC
  Administered 2024-10-18: 1000 ug via INTRAMUSCULAR

## 2024-10-18 NOTE — Assessment & Plan Note (Addendum)
 Family requests a trial of Aricept  which I think is reasonable and the patient agrees to.  Discussed potential side effects including nausea, diarrhea, and bradycardia, with a low likelihood of occurrence at low doses. Emphasized the importance of monitoring for side effects and the need for long-term use to assess efficacy. - Prescribed low-dose Aricept  (5 mg) to be taken at bedtime. - Instructed to monitor for side effects such as nausea, diarrhea, and bradycardia. - Advised to continue medication as long as tolerated, with the option to discontinue if significant side effects occur. -F/u with Neurology as directed.

## 2024-10-18 NOTE — Assessment & Plan Note (Signed)
 Orders:    cyanocobalamin  (VITAMIN B-12) injection 1,000 mcg

## 2024-10-18 NOTE — Assessment & Plan Note (Addendum)
 Acute right-sided low back pain with mild gait instability Intermittent right-sided low back pain, possibly due to arthritis in the SI joint. Pain is localized to the hip area and exacerbated by stepping up. No radiation of pain down the thigh. Gait instability is a concern due to potential fall risk. - Referred to physical therapy for pain management and gait stability. - Recommended brief use of ibuprofen with food for pain relief. - Advised use of heating pad for symptomatic relief. - Will consider cortisone injection if pain persists  Orders:   Ambulatory referral to Physical Therapy

## 2024-10-18 NOTE — Assessment & Plan Note (Addendum)
 Family states that her dentist feels her dentition is fine and we can use prescription medication for this when the patient is ready.  Patient and family will consider.

## 2024-10-18 NOTE — Assessment & Plan Note (Deleted)
 Brenda Lang

## 2024-10-18 NOTE — Progress Notes (Signed)
 Patient is in office today for a nurse visit for B12 Injection. Patient Injection was given in the  Right deltoid. Patient tolerated injection well.

## 2024-10-18 NOTE — Progress Notes (Signed)
 "  Established Patient Office Visit  Subjective   Patient ID: Brenda Lang, female    DOB: [birth year]  Age: 89 y.o. MRN: [id number]  Chief Complaint  Patient presents with   Follow-up    Follow-up    Discussed the use of AI scribe software for clinical note transcription with the patient, who gave verbal consent to proceed.  History of Present Illness Brenda Lang is an 89 year old female who presents with right hip pain and concerns about mild gait instability.  She experiences a quick, sharp pain in her right hip, particularly when stepping up steps. The pain is localized to her right SI joint and does not radiate down the back of her thigh. She is concerned about the potential for falling and breaking a hip.  Family mentions a previous visit to a neurologist on December 23rd, where there was a disagreement with the neurologist's assessment. The neurologist didn't reportedly document the family's concerns accurately, leading to dissatisfaction with the visit. The neurologist's assessment was that the patient did not have dementia because she could dress, bathe, and feed herself. This was contrary to the family's observations, as she had errors on a mini-mental exam, including getting the day, date, and year wrong.  The family is considering medication options to slow cognitive decline. She has previously been prescribed Aricept  but was hesitant to take it due to concerns about side effects.  She has a social history of never smoking or drinking alcohol , and she attributes her longevity to following her mother's advice. Her mother lived to be 60 years old.    Past Medical History:  Diagnosis Date   Colonoscopy refused    and now of questionable value   History of UTI    Recurring, with Urology encouraged.   Hypertension    Mild cognitive impairment    f/by Neurology   Osteoarthritis    Osteoporosis    Intolerant to Reclast    Pernicious anemia     Outpatient Encounter  Medications as of 10/18/2024  Medication Sig   donepezil  (ARICEPT ) 5 MG tablet Take 1 tablet (5 mg total) by mouth at bedtime.   olmesartan  (BENICAR ) 20 MG tablet Take 1 tablet (20 mg total) by mouth daily.   Apoaequorin (PREVAGEN PO) Take 1 capsule by mouth daily. (Patient not taking: Reported on 09/18/2024)   No facility-administered encounter medications on file as of 10/18/2024.    Social History[1]    Review of Systems  Constitutional:  Negative for fever and malaise/fatigue.  HENT:  Negative for hearing loss.   Cardiovascular:  Negative for chest pain.  Gastrointestinal:  Negative for abdominal pain.  Skin:  Negative for rash.  Neurological:  Negative for dizziness, tingling, tremors, sensory change, focal weakness, seizures, loss of consciousness and headaches.  Psychiatric/Behavioral:  Positive for memory loss. Negative for depression and hallucinations. The patient does not have insomnia.       Objective:     BP (!) 137/90 (Cuff Size: Normal)   Pulse 87   Temp (!) 97.4 F (36.3 C) (Oral)   Resp 16   Wt 140 lb 6.4 oz (63.7 kg)   SpO2 97%   BMI 25.68 kg/m    Physical Exam Constitutional:      General: She is not in acute distress.    Appearance: Normal appearance.  HENT:     Head: Normocephalic.  Cardiovascular:     Rate and Rhythm: Normal rate and regular rhythm.     Pulses: Normal  pulses.     Heart sounds: Normal heart sounds.  Pulmonary:     Effort: Pulmonary effort is normal.     Breath sounds: Normal breath sounds.  Abdominal:     General: Bowel sounds are normal.     Palpations: Abdomen is soft.  Musculoskeletal:     Cervical back: Neck supple. No tenderness.     Right lower leg: No edema.     Left lower leg: No edema.     Comments: Mild right sided SI joint pain with negative SLR.  Neurological:     General: No focal deficit present.     Mental Status: She is alert.  Psychiatric:        Mood and Affect: Mood normal.        Behavior: Behavior  normal.        Thought Content: Thought content normal.      No results found for any visits on 10/18/24.    The ASCVD Risk score (Arnett DK, et al., 2019) failed to calculate for the following reasons:   The 2019 ASCVD risk score is only valid for ages 40 to 44   * - Cholesterol units were assumed    Assessment & Plan:   Assessment & Plan Age-related osteoporosis without current pathological fracture Family states that her dentist feels her dentition is fine and we can use prescription medication for this when the patient is ready.  Patient and family will consider.    Mild cognitive impairment Family requests a trial of Aricept  which I think is reasonable and the patient agrees to.  Discussed potential side effects including nausea, diarrhea, and bradycardia, with a low likelihood of occurrence at low doses. Emphasized the importance of monitoring for side effects and the need for long-term use to assess efficacy. - Prescribed low-dose Aricept  (5 mg) to be taken at bedtime. - Instructed to monitor for side effects such as nausea, diarrhea, and bradycardia. - Advised to continue medication as long as tolerated, with the option to discontinue if significant side effects occur. -F/u with Neurology as directed.     Acute right-sided low back pain without sciatica Acute right-sided low back pain with mild gait instability Intermittent right-sided low back pain, possibly due to arthritis in the SI joint. Pain is localized to the hip area and exacerbated by stepping up. No radiation of pain down the thigh. Gait instability is a concern due to potential fall risk. - Referred to physical therapy for pain management and gait stability. - Recommended brief use of ibuprofen with food for pain relief. - Advised use of heating pad for symptomatic relief. - Will consider cortisone injection if pain persists  Orders:   Ambulatory referral to Physical Therapy  Pernicious  anemia  Orders:   cyanocobalamin  (VITAMIN B12) injection 1,000 mcg    Return in about 3 months (around 01/16/2025) for chronic follow-up.    REDDING PONCE NORLEEN FALCON., MD     [1]  Social History Tobacco Use   Smoking status: Never    Passive exposure: Never   Smokeless tobacco: Never  Vaping Use   Vaping status: Never Used  Substance Use Topics   Alcohol  use: No   Drug use: Never   "

## 2024-10-19 ENCOUNTER — Other Ambulatory Visit (HOSPITAL_BASED_OUTPATIENT_CLINIC_OR_DEPARTMENT_OTHER): Payer: Self-pay | Admitting: Family Medicine

## 2024-10-19 ENCOUNTER — Other Ambulatory Visit (HOSPITAL_BASED_OUTPATIENT_CLINIC_OR_DEPARTMENT_OTHER): Payer: Self-pay

## 2024-10-19 ENCOUNTER — Encounter (HOSPITAL_BASED_OUTPATIENT_CLINIC_OR_DEPARTMENT_OTHER): Payer: Self-pay | Admitting: *Deleted

## 2024-10-19 DIAGNOSIS — M810 Age-related osteoporosis without current pathological fracture: Secondary | ICD-10-CM

## 2024-10-19 MED ORDER — ALENDRONATE SODIUM 70 MG PO TABS
70.0000 mg | ORAL_TABLET | ORAL | 11 refills | Status: AC
  Filled 2024-10-19: qty 4, 28d supply, fill #0

## 2024-10-23 ENCOUNTER — Other Ambulatory Visit (HOSPITAL_BASED_OUTPATIENT_CLINIC_OR_DEPARTMENT_OTHER): Payer: Self-pay

## 2024-10-25 ENCOUNTER — Ambulatory Visit (HOSPITAL_BASED_OUTPATIENT_CLINIC_OR_DEPARTMENT_OTHER)

## 2024-10-30 ENCOUNTER — Telehealth (HOSPITAL_BASED_OUTPATIENT_CLINIC_OR_DEPARTMENT_OTHER): Payer: Self-pay | Admitting: Family Medicine

## 2024-10-30 NOTE — Telephone Encounter (Signed)
 Copied from CRM #[id number]. Topic: General - Call Back - No Documentation >> Oct 30, 2024 10:37 AM Roselie BROCKS wrote: Reason for CRM: Patients daughter, Nichola Sieving, calling for Vickie at front desk, requests a return call to [phone]

## 2024-11-19 ENCOUNTER — Ambulatory Visit (HOSPITAL_BASED_OUTPATIENT_CLINIC_OR_DEPARTMENT_OTHER)

## 2025-01-16 ENCOUNTER — Ambulatory Visit (HOSPITAL_BASED_OUTPATIENT_CLINIC_OR_DEPARTMENT_OTHER): Admitting: Family Medicine
